# Patient Record
Sex: Female | Born: 1955 | Race: White | Hispanic: No | Marital: Married | Smoking: Never smoker
Health system: Southern US, Community
[De-identification: ages and names within clinical notes are randomized; demographics above are authoritative.]

## PROBLEM LIST (undated history)

## (undated) DIAGNOSIS — Z862 Personal history of diseases of the blood and blood-forming organs and certain disorders involving the immune mechanism: Secondary | ICD-10-CM

## (undated) DIAGNOSIS — C439 Malignant melanoma of skin, unspecified: Secondary | ICD-10-CM

## (undated) DIAGNOSIS — I4891 Unspecified atrial fibrillation: Secondary | ICD-10-CM

## (undated) DIAGNOSIS — M8580 Other specified disorders of bone density and structure, unspecified site: Secondary | ICD-10-CM

## (undated) DIAGNOSIS — I499 Cardiac arrhythmia, unspecified: Secondary | ICD-10-CM

## (undated) HISTORY — PX: BREAST EXCISIONAL BIOPSY: SUR124

## (undated) HISTORY — PX: SPLENECTOMY: SUR1306

---

## 2004-07-07 ENCOUNTER — Ambulatory Visit: Payer: Self-pay | Admitting: Unknown Physician Specialty

## 2005-07-26 ENCOUNTER — Ambulatory Visit: Payer: Self-pay | Admitting: Unknown Physician Specialty

## 2006-07-31 ENCOUNTER — Ambulatory Visit: Payer: Self-pay | Admitting: Unknown Physician Specialty

## 2007-08-15 ENCOUNTER — Ambulatory Visit: Payer: Self-pay | Admitting: Unknown Physician Specialty

## 2008-10-15 ENCOUNTER — Ambulatory Visit: Payer: Self-pay | Admitting: Unknown Physician Specialty

## 2009-02-15 ENCOUNTER — Ambulatory Visit: Payer: Self-pay | Admitting: Gastroenterology

## 2009-12-09 ENCOUNTER — Ambulatory Visit: Payer: Self-pay | Admitting: Unknown Physician Specialty

## 2010-12-12 ENCOUNTER — Ambulatory Visit: Payer: Self-pay | Admitting: Unknown Physician Specialty

## 2011-07-04 HISTORY — PX: BREAST BIOPSY: SHX20

## 2011-12-26 ENCOUNTER — Ambulatory Visit: Payer: Self-pay | Admitting: Obstetrics and Gynecology

## 2012-01-19 ENCOUNTER — Ambulatory Visit: Payer: Self-pay | Admitting: General Surgery

## 2012-01-22 LAB — PATHOLOGY REPORT

## 2012-07-22 ENCOUNTER — Ambulatory Visit: Payer: Self-pay | Admitting: General Surgery

## 2014-10-20 NOTE — Op Note (Signed)
PATIENT NAME:  Chelsea Marquez, Chelsea Marquez MR#:  [id number] DATE OF BIRTH:  [birth year]  DATE OF PROCEDURE:  01/19/2012  PREOPERATIVE DIAGNOSIS: Microcalcifications of left breast.   POSTOPERATIVE DIAGNOSIS: Microcalcifications of left breast.  OPERATIVE PROCEDURE: Needle localization and biopsy of left breast.   SURGEON: Hervey Ard, MD   ANESTHESIA: General by LMA under Dr. Benjamine Mola, Marcaine 0.5% plain, 30 mL local infiltration.   ESTIMATED BLOOD LOSS: Less than 20 mL.   CLINICAL NOTE: This 58 year old woman showed areas of microcalcifications in the retroareolar area. They were felt to be too superficial for successful stereotactic biopsy. Needle localization was completed by [employer], MD, with the wire essentially posterior to the area of concern.   OPERATIVE NOTE: With the patient under adequate general anesthesia, the breast was carefully prepped with ChloraPrep to prevent dislodging the needle. Ultrasound was used to identify the thickened portion of the wire. A curvilinear incision along the edge of the areola, from the 2 to 7 o'clock position was made and carried down through the skin and subcutaneous tissue with hemostasis achieved by electrocautery. The area of the wire was identified superior to the thick portion (which was adjacent to the calcification). A 2 cm diameter core, 4 cm long section of tissue was removed, orientated and sent for specimen radiograph. Only a few calcifications were evident near the superior aspect of the entry point of the wire. Re-review of the post needle localization mammogram suggests that the calcifications might be more medially based. A second piece of tissue 1 cm in diameter encompassing the superior, medial, and anterior aspects of the original biopsy site was then sent, additional mammography failed to show additional calcifications. At this point, it was elected to terminate the procedure. Meticulous hemostasis was achieved with electrocautery. The tissue was  approximated in multiple layers with 2-0 Vicryl figure-of-eight sutures to reconstruct the retroareolar breast volume. The skin    was closed with running 4-0 Vicryl subcuticular suture. Benzoin and Steri-Strips were applied. A fluff gauze dressing, Kerlix, and an Ace wrap was then applied.   The patient tolerated the procedure well and was taken to the recovery room in stable condition.  ____________________________ Robert Bellow, MD jwb:slb D: 01/19/2012 18:08:24 ET T: 01/20/2012 10:02:09 ET JOB#: [id number]  cc: Robert Bellow, MD, <Dictator> Youlanda Roys. Lovie Macadamia, MD Delsa Sale, MD JEFFREY Amedeo Kinsman MD ELECTRONICALLY SIGNED 01/22/2012 20:37

## 2016-05-17 ENCOUNTER — Other Ambulatory Visit: Payer: Self-pay | Admitting: Obstetrics & Gynecology

## 2016-05-17 DIAGNOSIS — Z1231 Encounter for screening mammogram for malignant neoplasm of breast: Secondary | ICD-10-CM

## 2016-06-28 ENCOUNTER — Ambulatory Visit
Admission: RE | Admit: 2016-06-28 | Discharge: 2016-06-28 | Disposition: A | Discharge status: Home / Self Care | Payer: BLUE CROSS/BLUE SHIELD | Source: Ambulatory Visit | Attending: Obstetrics & Gynecology | Admitting: Obstetrics & Gynecology

## 2016-06-28 DIAGNOSIS — Z1231 Encounter for screening mammogram for malignant neoplasm of breast: Secondary | ICD-10-CM | POA: Diagnosis present

## 2017-05-23 ENCOUNTER — Other Ambulatory Visit: Payer: Self-pay | Admitting: Obstetrics & Gynecology

## 2017-05-23 DIAGNOSIS — Z1231 Encounter for screening mammogram for malignant neoplasm of breast: Secondary | ICD-10-CM

## 2017-06-29 ENCOUNTER — Ambulatory Visit
Admission: RE | Admit: 2017-06-29 | Discharge: 2017-06-29 | Disposition: A | Discharge status: Home / Self Care | Payer: BLUE CROSS/BLUE SHIELD | Source: Ambulatory Visit | Attending: Obstetrics & Gynecology | Admitting: Obstetrics & Gynecology

## 2017-06-29 DIAGNOSIS — Z1231 Encounter for screening mammogram for malignant neoplasm of breast: Secondary | ICD-10-CM | POA: Diagnosis present

## 2017-11-29 ENCOUNTER — Other Ambulatory Visit: Payer: Self-pay | Admitting: Student

## 2017-11-29 ENCOUNTER — Ambulatory Visit
Admission: RE | Admit: 2017-11-29 | Discharge: 2017-11-29 | Disposition: A | Discharge status: Home / Self Care | Payer: BLUE CROSS/BLUE SHIELD | Source: Ambulatory Visit | Attending: Student | Admitting: Student

## 2017-11-29 DIAGNOSIS — M79604 Pain in right leg: Secondary | ICD-10-CM

## 2017-11-29 DIAGNOSIS — M79661 Pain in right lower leg: Secondary | ICD-10-CM | POA: Diagnosis not present

## 2018-05-24 ENCOUNTER — Other Ambulatory Visit: Payer: Self-pay | Admitting: Obstetrics & Gynecology

## 2018-05-24 DIAGNOSIS — Z1231 Encounter for screening mammogram for malignant neoplasm of breast: Secondary | ICD-10-CM

## 2018-07-01 ENCOUNTER — Ambulatory Visit
Admission: RE | Admit: 2018-07-01 | Discharge: 2018-07-01 | Disposition: A | Discharge status: Home / Self Care | Payer: BLUE CROSS/BLUE SHIELD | Source: Ambulatory Visit | Attending: Obstetrics & Gynecology | Admitting: Obstetrics & Gynecology

## 2018-07-01 DIAGNOSIS — Z1231 Encounter for screening mammogram for malignant neoplasm of breast: Secondary | ICD-10-CM | POA: Diagnosis present

## 2019-04-07 ENCOUNTER — Other Ambulatory Visit: Payer: Self-pay

## 2019-04-07 DIAGNOSIS — Z20822 Contact with and (suspected) exposure to covid-19: Secondary | ICD-10-CM

## 2019-04-07 DIAGNOSIS — Z20828 Contact with and (suspected) exposure to other viral communicable diseases: Secondary | ICD-10-CM

## 2019-04-09 ENCOUNTER — Telehealth: Payer: Self-pay | Admitting: *Deleted

## 2019-04-09 LAB — NOVEL CORONAVIRUS, NAA: SARS-CoV-2, NAA: NOT DETECTED

## 2019-04-09 NOTE — Telephone Encounter (Signed)
Patient called to obtain COVID 19 test results from test done on 04/07/2019.  Notified patient of negative results.

## 2019-05-19 ENCOUNTER — Other Ambulatory Visit: Payer: Self-pay | Admitting: Obstetrics & Gynecology

## 2019-05-19 DIAGNOSIS — Z1231 Encounter for screening mammogram for malignant neoplasm of breast: Secondary | ICD-10-CM

## 2019-07-03 ENCOUNTER — Ambulatory Visit
Admission: RE | Admit: 2019-07-03 | Discharge: 2019-07-03 | Disposition: A | Discharge status: Home / Self Care | Payer: BC Managed Care – PPO | Source: Ambulatory Visit | Attending: Obstetrics & Gynecology | Admitting: Obstetrics & Gynecology

## 2019-07-03 DIAGNOSIS — Z1231 Encounter for screening mammogram for malignant neoplasm of breast: Secondary | ICD-10-CM

## 2019-12-03 IMAGING — MG DIGITAL SCREENING BILATERAL MAMMOGRAM WITH TOMO AND CAD
8 series · 9 of 24 positions shown · non-contrast
Comparison: Previous exam(s).

CLINICAL DATA: Screening.

EXAM:
DIGITAL SCREENING BILATERAL MAMMOGRAM WITH TOMO AND CAD

[R MLO synth-2D]
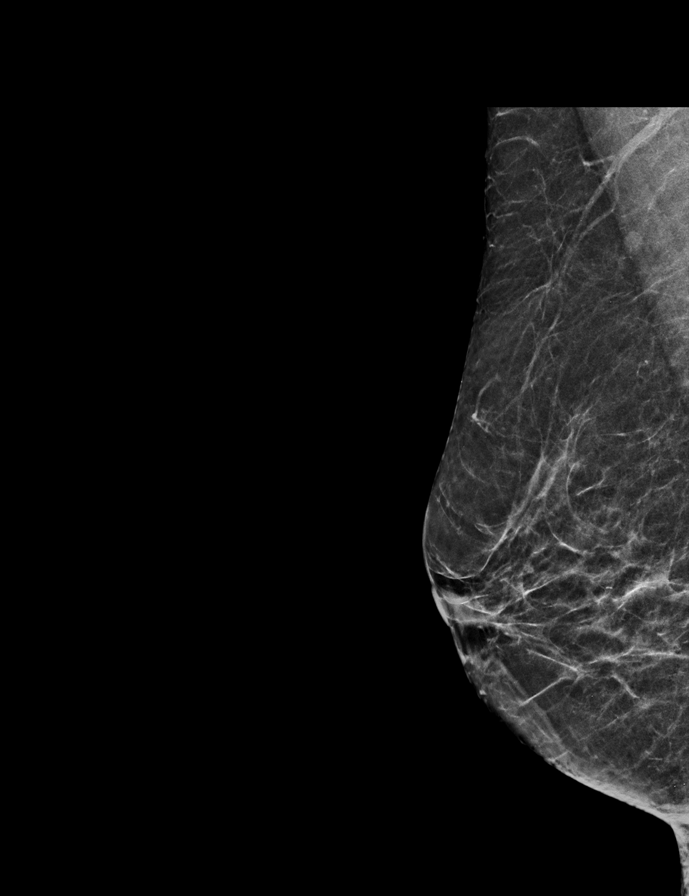

[L MLO synth-2D]
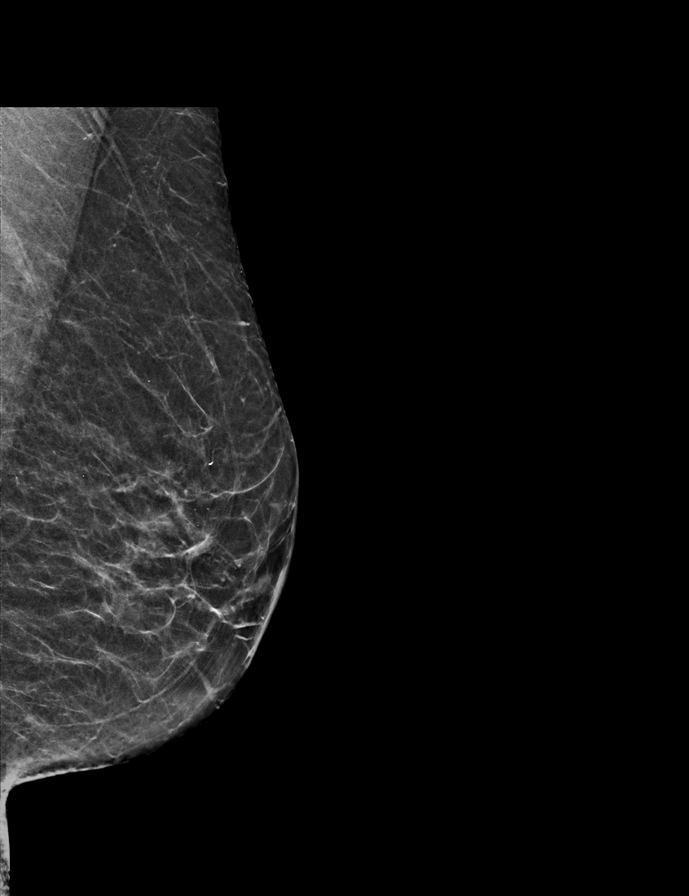

[R CC synth-2D]
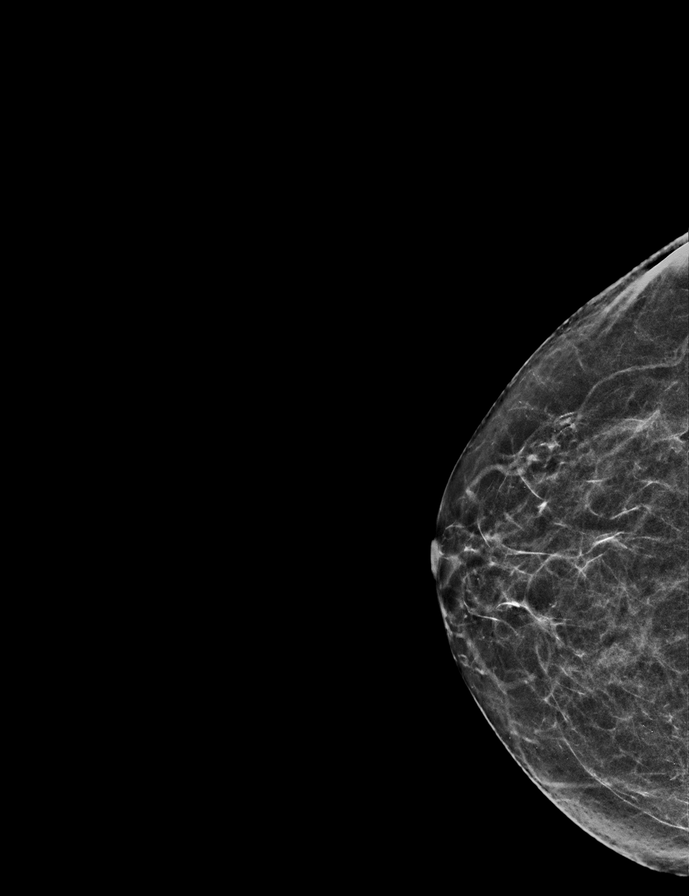

[L CC synth-2D]
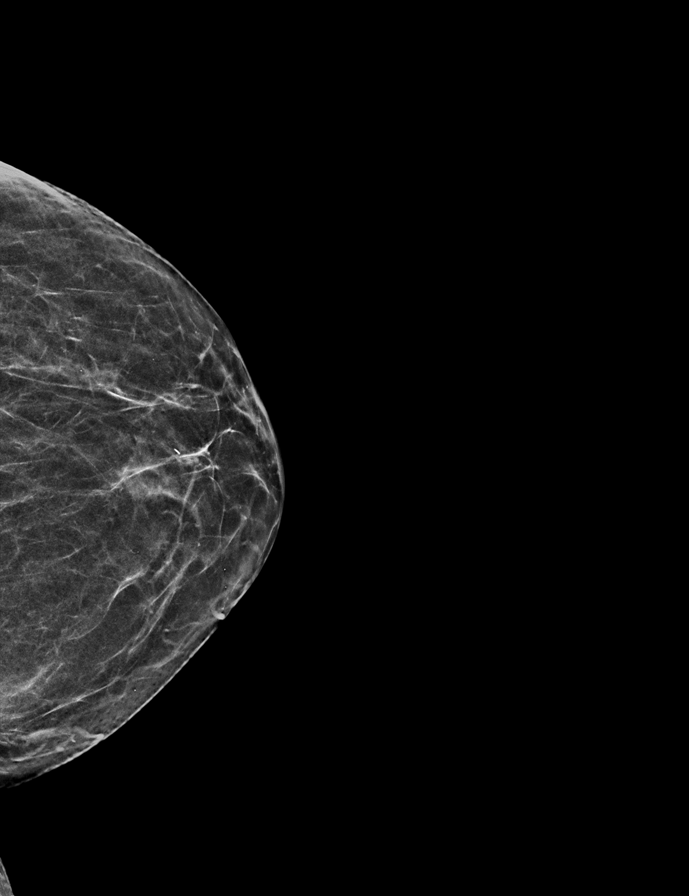

[R MLO tomo · 2 of 52 frames shown]
[frame 17/52]
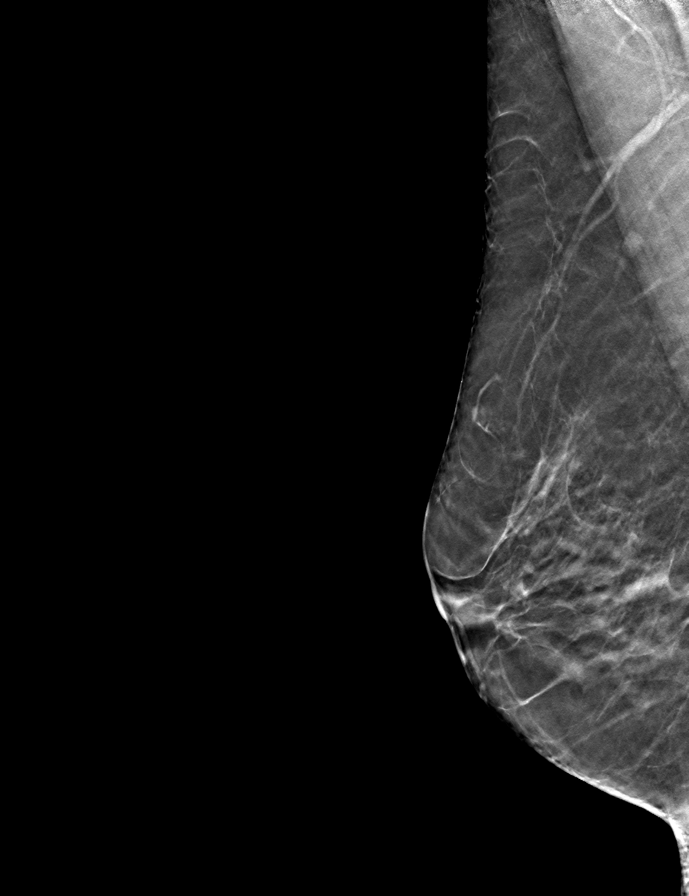
[frame 27/52]
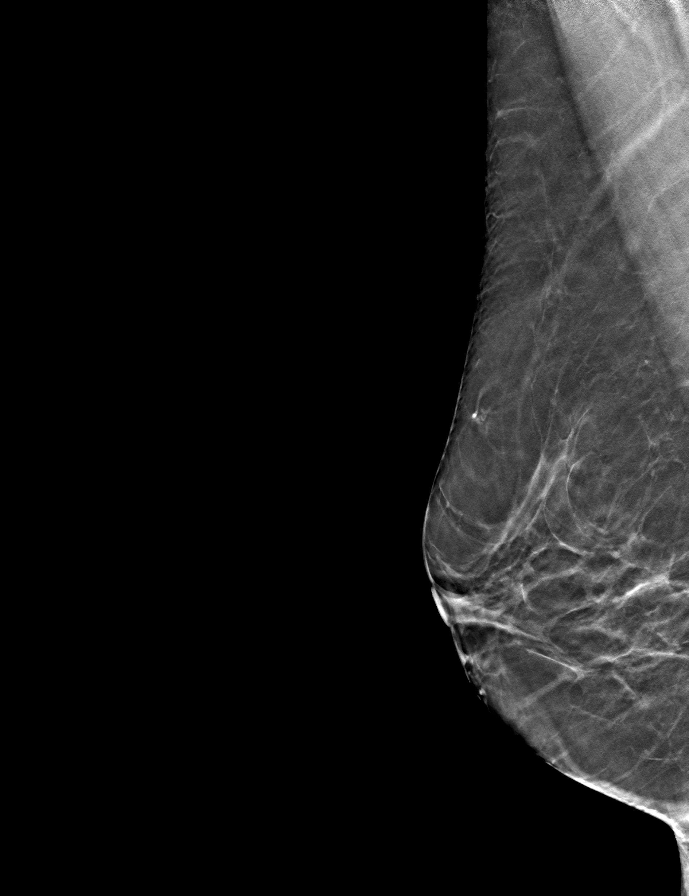

[L CC tomo · tomo slice 23/44.0]
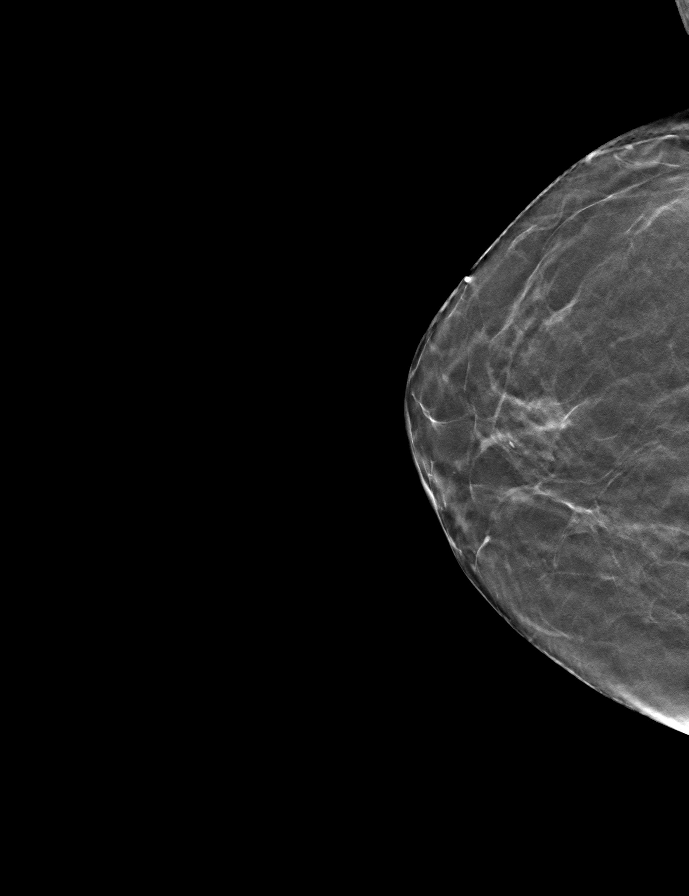

[L MLO tomo · tomo slice 26/51.0]
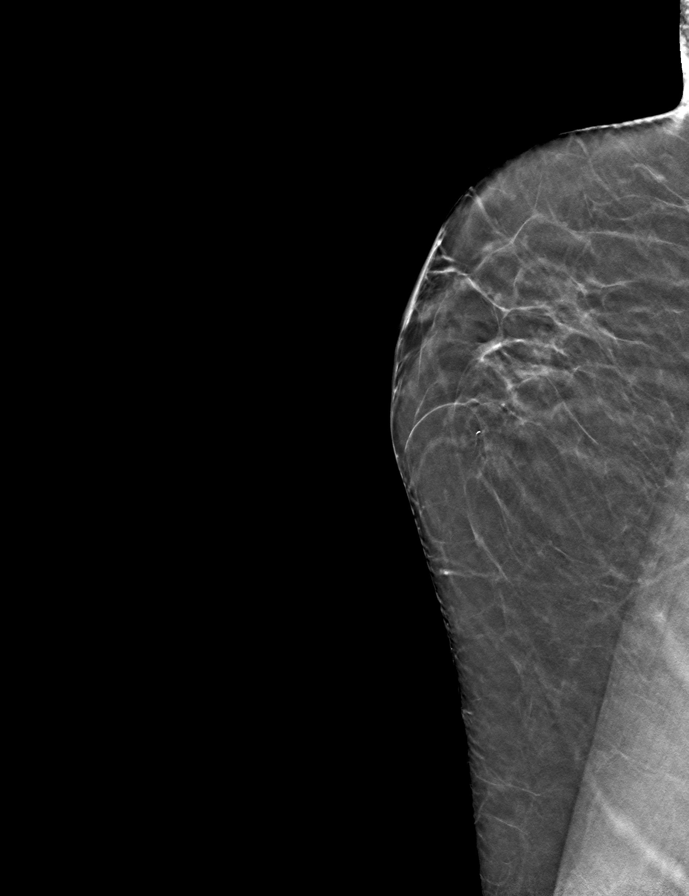

[R CC tomo · tomo slice 23/46.0]
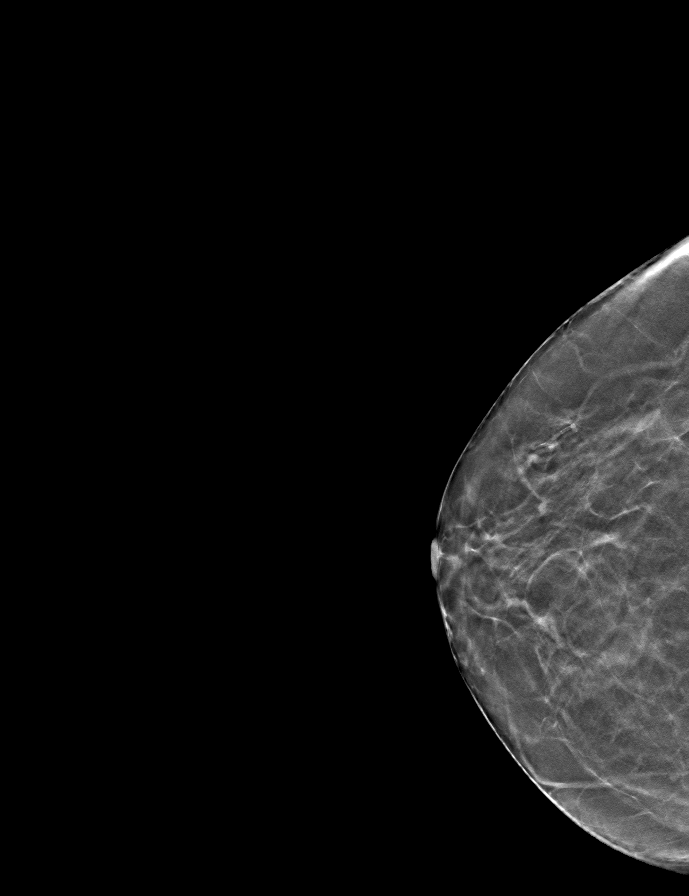

[9 of 24 positions shown; findings below may reference images not displayed]

ACR Breast Density Category b: There are scattered areas of
fibroglandular density.
FINDINGS: There are no findings suspicious for malignancy. Images were
processed with CAD.
IMPRESSION: No mammographic evidence of malignancy. A result letter of this
screening mammogram will be mailed directly to the patient.

RECOMMENDATION:
Screening mammogram in one year. (Code:[id number])

BI-RADS CATEGORY  1: Negative.

## 2020-03-16 ENCOUNTER — Telehealth (HOSPITAL_COMMUNITY): Payer: Self-pay | Admitting: Oncology

## 2020-03-16 ENCOUNTER — Encounter: Payer: Self-pay | Admitting: Oncology

## 2020-03-16 NOTE — Telephone Encounter (Signed)
Called to Discuss with patient about Covid symptoms and the use of regeneron, a monoclonal antibody infusion for those with mild to moderate Covid symptoms and at a high risk of hospitalization.     Pt is qualified for this infusion at the [facility] infusion center due to co-morbid conditions and/or a member of an at-risk group.     Unable to reach pt. Left message to return call  No past medical history on file.  Rulon Abide  AGNP-C [phone] ([place])

## 2020-06-01 ENCOUNTER — Other Ambulatory Visit: Payer: Self-pay | Admitting: Obstetrics & Gynecology

## 2020-06-01 DIAGNOSIS — Z1231 Encounter for screening mammogram for malignant neoplasm of breast: Secondary | ICD-10-CM

## 2020-06-18 ENCOUNTER — Other Ambulatory Visit
Admission: RE | Admit: 2020-06-18 | Discharge: 2020-06-18 | Disposition: A | Discharge status: Home / Self Care | Payer: BC Managed Care – PPO | Source: Ambulatory Visit | Attending: Cardiology | Admitting: Cardiology

## 2020-06-18 ENCOUNTER — Other Ambulatory Visit: Payer: Self-pay

## 2020-06-18 DIAGNOSIS — Z01812 Encounter for preprocedural laboratory examination: Secondary | ICD-10-CM | POA: Diagnosis not present

## 2020-06-18 DIAGNOSIS — Z20822 Contact with and (suspected) exposure to covid-19: Secondary | ICD-10-CM | POA: Insufficient documentation

## 2020-06-19 LAB — SARS CORONAVIRUS 2 (TAT 6-24 HRS): SARS Coronavirus 2: NEGATIVE

## 2020-06-21 ENCOUNTER — Other Ambulatory Visit: Payer: Self-pay | Admitting: Cardiology

## 2020-06-22 ENCOUNTER — Ambulatory Visit
Admission: RE | Admit: 2020-06-22 | Discharge: 2020-06-22 | Disposition: A | Discharge status: Home / Self Care | Payer: BC Managed Care – PPO | Attending: Cardiology | Admitting: Cardiology

## 2020-06-22 ENCOUNTER — Ambulatory Visit: Payer: BC Managed Care – PPO | Admitting: Anesthesiology

## 2020-06-22 ENCOUNTER — Encounter: Payer: Self-pay | Admitting: Cardiology

## 2020-06-22 ENCOUNTER — Encounter
Admission: RE | Disposition: A | Discharge status: Home / Self Care | Payer: Self-pay | Source: Home / Self Care | Attending: Cardiology

## 2020-06-22 DIAGNOSIS — Z79899 Other long term (current) drug therapy: Secondary | ICD-10-CM | POA: Diagnosis not present

## 2020-06-22 DIAGNOSIS — Z85828 Personal history of other malignant neoplasm of skin: Secondary | ICD-10-CM | POA: Diagnosis not present

## 2020-06-22 DIAGNOSIS — Z7901 Long term (current) use of anticoagulants: Secondary | ICD-10-CM | POA: Diagnosis not present

## 2020-06-22 DIAGNOSIS — D693 Immune thrombocytopenic purpura: Secondary | ICD-10-CM | POA: Diagnosis not present

## 2020-06-22 DIAGNOSIS — I480 Paroxysmal atrial fibrillation: Secondary | ICD-10-CM | POA: Diagnosis present

## 2020-06-22 HISTORY — PX: CARDIOVERSION: SHX1299

## 2020-06-22 HISTORY — DX: Unspecified atrial fibrillation: I48.91

## 2020-06-22 HISTORY — DX: Malignant melanoma of skin, unspecified: C43.9

## 2020-06-22 SURGERY — CARDIOVERSION
Anesthesia: General

## 2020-06-22 MED ORDER — HYDROCORTISONE 1 % EX CREA
1.0000 "application " | TOPICAL_CREAM | Freq: Three times a day (TID) | CUTANEOUS | Status: DC | PRN
  Filled 2020-06-22: qty 28

## 2020-06-22 MED ORDER — PROPOFOL 10 MG/ML IV BOLUS
INTRAVENOUS | Status: DC | PRN
Start: 2020-06-22 — End: 2020-06-22
  Administered 2020-06-22: 70 mg via INTRAVENOUS

## 2020-06-22 MED ORDER — FENTANYL CITRATE (PF) 100 MCG/2ML IJ SOLN: 25.0000 ug | INTRAMUSCULAR | Status: DC | PRN

## 2020-06-22 MED ORDER — SODIUM CHLORIDE 0.9 % IV SOLN
INTRAVENOUS | Status: DC
  Administered 2020-06-22: 1000 mL via INTRAVENOUS

## 2020-06-22 MED ORDER — ONDANSETRON HCL 4 MG/2ML IJ SOLN: 4.0000 mg | Freq: Once | INTRAMUSCULAR | Status: DC | PRN

## 2020-06-22 MED ORDER — PROPOFOL 10 MG/ML IV BOLUS
INTRAVENOUS | Status: AC
  Filled 2020-06-22: qty 20

## 2020-06-22 NOTE — Anesthesia Preprocedure Evaluation (Signed)
Anesthesia Evaluation  Patient identified by MRN, date of birth, ID band Patient awake    Reviewed: Allergy & Precautions, NPO status , Patient's Chart, lab work & pertinent test results  Airway Mallampati: II  TM Distance: >3 FB     Dental  (+) Teeth Intact   Pulmonary neg pulmonary ROS,    Pulmonary exam normal        Cardiovascular Normal cardiovascular exam+ dysrhythmias Atrial Fibrillation      Neuro/Psych negative neurological ROS  negative psych ROS   GI/Hepatic negative GI ROS, Neg liver ROS,   Endo/Other  negative endocrine ROS  Renal/GU negative Renal ROS  negative genitourinary   Musculoskeletal negative musculoskeletal ROS (+)   Abdominal Normal abdominal exam  (+)   Peds negative pediatric ROS (+)  Hematology negative hematology ROS (+)   Anesthesia Other Findings   Reproductive/Obstetrics                             Anesthesia Physical Anesthesia Plan  ASA: III  Anesthesia Plan: General   Post-op Pain Management:    Induction: Intravenous  PONV Risk Score and Plan:   Airway Management Planned: Nasal Cannula  Additional Equipment:   Intra-op Plan:   Post-operative Plan:   Informed Consent: I have reviewed the patients History and Physical, chart, labs and discussed the procedure including the risks, benefits and alternatives for the proposed anesthesia with the patient or authorized representative who has indicated his/her understanding and acceptance.     Dental advisory given  Plan Discussed with: CRNA and Surgeon  Anesthesia Plan Comments:         Anesthesia Quick Evaluation

## 2020-06-22 NOTE — Transfer of Care (Signed)
Immediate Anesthesia Transfer of Care Note  Patient: Chelsea Marquez  Procedure(s) Performed: CARDIOVERSION (N/A )  Patient Location: PACU and [facility]  Anesthesia Type:General  Level of Consciousness: drowsy and patient cooperative  Airway & Oxygen Therapy: Patient Spontanous Breathing and Patient connected to nasal cannula oxygen  Post-op Assessment: Report given to RN and Post -op Vital signs reviewed and stable  Post vital signs: Reviewed and stable  Last Vitals:  Vitals Value Taken Time  BP 118/69 06/22/20 0745  Temp    Pulse 58 06/22/20 0746  Resp 19 06/22/20 0746  SpO2 99 % 06/22/20 0746    Last Pain:  Vitals:   06/22/20 0714  TempSrc: Oral  PainSc: 0-No pain         Complications: No complications documented.

## 2020-06-22 NOTE — Discharge Instructions (Signed)
Moderate Conscious Sedation, Adult, Care After These instructions provide you with information about caring for yourself after your procedure. Your health care provider may also give you more specific instructions. Your treatment has been planned according to current medical practices, but problems sometimes occur. Call your health care provider if you have any problems or questions after your procedure. What can I expect after the procedure? After your procedure, it is common:  To feel sleepy for several hours.  To feel clumsy and have poor balance for several hours.  To have poor judgment for several hours.  To vomit if you eat too soon. Follow these instructions at home: For at least 24 hours after the procedure:   Do not: ? Participate in activities where you could fall or become injured. ? Drive. ? Use heavy machinery. ? Drink alcohol. ? Take sleeping pills or medicines that cause drowsiness. ? Make important decisions or sign legal documents. ? Take care of children on your own.  Rest. Eating and drinking  Follow the diet recommended by your health care provider.  If you vomit: ? Drink water, juice, or soup when you can drink without vomiting. ? Make sure you have little or no nausea before eating solid foods. General instructions  Have a responsible adult stay with you until you are awake and alert.  Take over-the-counter and prescription medicines only as told by your health care provider.  If you smoke, do not smoke without supervision.  Keep all follow-up visits as told by your health care provider. This is important. Contact a health care provider if:  You keep feeling nauseous or you keep vomiting.  You feel light-headed.  You develop a rash.  You have a fever. Get help right away if:  You have trouble breathing. This information is not intended to replace advice given to you by your health care provider. Make sure you discuss any questions you have  with your health care provider. Document Revised: 06/01/2017 Document Reviewed: 10/09/2015 Elsevier Patient Education  2020 [employer] Electrical Cardioversion Electrical cardioversion is the delivery of a jolt of electricity to restore a normal rhythm to the heart. A rhythm that is too fast or is not regular keeps the heart from pumping well. In this procedure, sticky patches or metal paddles are placed on the chest to deliver electricity to the heart from a device. This procedure may be done in an emergency if:  There is low or no blood pressure as a result of the heart rhythm.  Normal rhythm must be restored as fast as possible to protect the brain and heart from further damage.  It may save a life. This may also be a scheduled procedure for irregular or fast heart rhythms that are not immediately life-threatening. Tell a health care provider about:  Any allergies you have.  All medicines you are taking, including vitamins, herbs, eye drops, creams, and over-the-counter medicines.  Any problems you or family members have had with anesthetic medicines.  Any blood disorders you have.  Any surgeries you have had.  Any medical conditions you have.  Whether you are pregnant or may be pregnant. What are the risks? Generally, this is a safe procedure. However, problems may occur, including:  Allergic reactions to medicines.  A blood clot that breaks free and travels to other parts of your body.  The possible return of an abnormal heart rhythm within hours or days after the procedure.  Your heart stopping (cardiac arrest). This is rare. What happens   before the procedure? Medicines  Your health care provider may have you start taking: ? Blood-thinning medicines (anticoagulants) so your blood does not clot as easily. ? Medicines to help stabilize your heart rate and rhythm.  Ask your health care provider about: ? Changing or stopping your regular medicines. This is  especially important if you are taking diabetes medicines or blood thinners. ? Taking medicines such as aspirin and ibuprofen. These medicines can thin your blood. Do not take these medicines unless your health care provider tells you to take them. ? Taking over-the-counter medicines, vitamins, herbs, and supplements. General instructions  Follow instructions from your health care provider about eating or drinking restrictions.  Plan to have someone take you home from the hospital or clinic.  If you will be going home right after the procedure, plan to have someone with you for 24 hours.  Ask your health care provider what steps will be taken to help prevent infection. These may include washing your skin with a germ-killing soap. What happens during the procedure?   An IV will be inserted into one of your veins.  Sticky patches (electrodes) or metal paddles may be placed on your chest.  You will be given a medicine to help you relax (sedative).  An electrical shock will be delivered. The procedure may vary among health care providers and hospitals. What can I expect after the procedure?  Your blood pressure, heart rate, breathing rate, and blood oxygen level will be monitored until you leave the hospital or clinic.  Your heart rhythm will be watched to make sure it does not change.  You may have some redness on the skin where the shocks were given. Follow these instructions at home:  Do not drive for 24 hours if you were given a sedative during your procedure.  Take over-the-counter and prescription medicines only as told by your health care provider.  Ask your health care provider how to check your pulse. Check it often.  Rest for 48 hours after the procedure or as told by your health care provider.  Avoid or limit your caffeine use as told by your health care provider.  Keep all follow-up visits as told by your health care provider. This is important. Contact a health  care provider if:  You feel like your heart is beating too quickly or your pulse is not regular.  You have a serious muscle cramp that does not go away. Get help right away if:  You have discomfort in your chest.  You are dizzy or you feel faint.  You have trouble breathing or you are short of breath.  Your speech is slurred.  You have trouble moving an arm or leg on one side of your body.  Your fingers or toes turn cold or blue. Summary  Electrical cardioversion is the delivery of a jolt of electricity to restore a normal rhythm to the heart.  This procedure may be done right away in an emergency or may be a scheduled procedure if the condition is not an emergency.  Generally, this is a safe procedure.  After the procedure, check your pulse often as told by your health care provider. This information is not intended to replace advice given to you by your health care provider. Make sure you discuss any questions you have with your health care provider. Document Revised: 01/20/2019 Document Reviewed: 01/20/2019 Elsevier Patient Education  2020 Elsevier Inc.  

## 2020-06-22 NOTE — Procedures (Signed)
Procedure:  Elective cardioversion  Indication: symptomatic afib  After informed consent, time out protocol and sedation with 70 mg iv propofol per dept of anesthesia, pt received a 120J biphasic, synchronized dc shock with conversion from afib to aflutter. A subsequent 150J synchronized biphasic dc shock converted to nsr. There no immediate complications.

## 2020-06-22 NOTE — Anesthesia Postprocedure Evaluation (Signed)
Anesthesia Post Note  Patient: Chelsea Marquez  Procedure(s) Performed: CARDIOVERSION (N/A )  Patient location during evaluation: Specials Recovery Anesthesia Type: General Level of consciousness: awake and alert and oriented Pain management: pain level controlled Vital Signs Assessment: post-procedure vital signs reviewed and stable Respiratory status: spontaneous breathing Cardiovascular status: blood pressure returned to baseline Anesthetic complications: no   No complications documented.   Last Vitals:  Vitals:   06/22/20 0815 06/22/20 0830  BP: 125/79   Pulse: (!) 56 62  Resp: 19 19  Temp:    SpO2: 99% 100%    Last Pain:  Vitals:   06/22/20 0815  TempSrc:   PainSc: 0-No pain                 Robyn Galati

## 2020-06-22 NOTE — Anesthesia Procedure Notes (Signed)
Procedure Name: MAC Date/Time: 06/22/2020 7:38 AM Performed by: Alvin Critchley, MD Pre-anesthesia Checklist: Patient identified, Emergency Drugs available, Suction available and Patient being monitored Patient Re-evaluated:Patient Re-evaluated prior to induction Oxygen Delivery Method: Nasal cannula

## 2020-07-08 NOTE — H&P (Signed)
Chief Complaint: Chief Complaint  Patient presents with  . Follow-up  Date of Service: 05/12/2020 Date of Birth: March 25, 1956 PCP: Dennison Nancy, MD  History of Present Illness: Ms. 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 At her yearly follow up visit on 03/31/20, she was noted to be in atrial fibrillation. She has a history of atrial fibrillation in the past, following an illness, in which she converted to NSR on amiodarone. She has completed the amiodarone load and has been compliant with Eliquis 5mg  BID with no evidence of side effects or bleeding. She continues to deny chest pain or chest pain, but admits to three instances of exertional bilateral upper shoulder tightness, resolving with rest. She denies shortness of breath, lower extremity swelling, orthopnea, or PND. She admits to increased fatigue, but denies dizziness, lightheadedness, or syncopal/presyncopal episodes. She has not used short acting diltiazem 30mg  for a resting heart rate greater than 100bpm.   We discussed the results of the echocardiogram at today's visit which revealed mildly reduced LV systolic function with an EF estimated at 45% with evidence of regional wall abnormalities. I personally reviewed the ECG performed in the office today which revealed atrial fibrillation at a controlled ventricular response of 91bpm with non-specific T wave abnormalities.   Past Medical and Surgical History  Past Medical History Past Medical History:  Diagnosis Date  . Atrial fibrillation (CMS-HCC)  . Cancer (CMS-HCC)  skin cancer, squamous cell  . H/O abnormal mammogram 2013  flat epithelial atypia  . Immune thrombocytopenia (CMS-HCC)   Past Surgical History She has a past surgical history that includes Splenectomy (1984); Incisional Biopsy Breast (Left); and melanoma  excision.   Medications and Allergies  Current Medications  Current Outpatient Medications  Medication Sig Dispense Refill  . AMIOdarone (PACERONE) 200 MG tablet Take 400mg  (2 tablets) twice a day for 1 week. Take 200mg  (1 tablet) twice a day for 3 weeks. Then take 200mg  (1 tablet) once a day. 40 tablet 3  . apixaban (ELIQUIS) 5 mg tablet Take 1 tablet (5 mg total) by mouth every 12 (twelve) hours 60 tablet 3  . CALCIUM CARBONATE (CALCIUM 500 ORAL) Take by mouth.  . cholecalciferol (VITAMIN D3) 1,000 unit capsule Take by mouth.  . diltiazem (CARDIZEM) 30 MG tablet TAKE 1 TABLET (30 MG TOTAL) BY MOUTH 4 (FOUR) TIMES DAILY 60 tablet 0  . magnesium 250 mg Tab Take 400 mg by mouth.  . OMEGA-3/DHA/EPA/FISH OIL (FISH OIL-OMEGA-3 FATTY ACIDS) 300-1,000 mg capsule Take by mouth.   No current facility-administered medications for this visit.   Allergies: Patient has no known allergies.  Social and Family History  Social History reports that she has never smoked. She has never used smokeless tobacco. She reports that she does not drink alcohol and does not use drugs.  Family History Family History  Problem Relation Age of Onset  . Emphysema Mother  . Osteoporosis (Thinning of bones) Mother  . Heart failure Father  . Prostate cancer Father  . Skin cancer Father  carcinoma  . Pacemaker Father  . Atrial fibrillation (Abnormal heart rhythm sometimes requiring treatment with blood thinners) Father  . High blood pressure (Hypertension) Brother  . Breast cancer Cousin  . Pulmonary fibrosis Brother   Review of Systems   Review of Systems: The patient denies chest pain, shortness of breath, orthopnea, paroxysmal nocturnal dyspnea, pedal  edema, palpitations, heart racing, fatigue, dizziness, lightheadedness, presyncope, syncope, leg pain, leg cramping. Review of 12 Systems is negative except as described in HPI.   Physical Examination   Vitals:BP 120/72  Pulse 92  Resp 16  Ht 170.2 cm (5'  7")  Wt 69.4 kg (153 lb)  BMI 23.96 kg/m  Ht:170.2 cm (5\' 7" ) Wt:69.4 kg (153 lb) surface area is 1.81 meters squared. Body mass index is 23.96 kg/m.  General: Well developed, well nourished. In no acute distress HEENT: Pupils equally reactive to light and accomodation  Neck: Supple without thyromegaly, or goiter. Carotid pulses 2+. No carotid bruits present.  Pulmonary: Clear to auscultation bilaterally; no wheezes, rales, rhonchi Cardiovascular: Irregularly irregular, rapid ventricular response. No gallops, murmurs or rubs Gastrointestinal: Soft nontender, nondistended, with normal bowel sounds Extremities: No cyanosis, clubbing, or edema Peripheral Pulses: 2+ in upper extremities, 2+ in lower extremities  Neurology: Alert and oriented X3 Pysch: Good affect. Responds appropriately  Assessment and Plan   65 y.o. female with  1. Paroxysmal atrial fibrillation (CMS-HCC)  -Currently in atrial fibrillation, with controlled ventricular response -Has completed amiodarone load; will reduce to 200mg  once daily  -Okay to use diltiazem 30mg  PRN for resting HR >100  -If she fails to convert on antiarrythmic therapy, will proceed with DCCV following 4 weeks of uninterrupted anticoagulation  -TSH, potassium, magnesium all within normal range  -Echocardiogram revealing mildly reduced LV systolic function with evidence of regional wall abnormalities - will proceed with ETT Myoview to rule out ischemic pathology  2.  3. History of ITP  -Occurred 25 years ago while pregnant; appears stable  Exertional chest pain  -With recent episodes of exertional left arm tightness, will proceed with an ETT Myoview     Orders Placed This Encounter  Procedures  . NM myocardial perfusion SPECT multiple (stress and rest)  . ECG 12-lead  . ECG stress test only   Return in about 6 weeks (around 06/23/2020).   Pt seen and examined. No change from above.

## 2020-08-04 ENCOUNTER — Ambulatory Visit
Admission: RE | Admit: 2020-08-04 | Discharge: 2020-08-04 | Disposition: A | Discharge status: Home / Self Care | Payer: Medicare PPO | Source: Ambulatory Visit | Attending: Obstetrics & Gynecology | Admitting: Obstetrics & Gynecology

## 2020-08-04 ENCOUNTER — Other Ambulatory Visit: Payer: Self-pay

## 2020-08-04 DIAGNOSIS — Z1231 Encounter for screening mammogram for malignant neoplasm of breast: Secondary | ICD-10-CM | POA: Diagnosis present

## 2021-02-08 ENCOUNTER — Ambulatory Visit: Payer: Medicare PPO | Admitting: Anesthesiology

## 2021-02-08 ENCOUNTER — Encounter: Payer: Self-pay | Admitting: Anesthesiology

## 2021-02-08 ENCOUNTER — Ambulatory Visit
Admission: RE | Admit: 2021-02-08 | Discharge: 2021-02-08 | Disposition: A | Discharge status: Home / Self Care | Payer: Medicare PPO | Attending: Gastroenterology | Admitting: Gastroenterology

## 2021-02-08 ENCOUNTER — Encounter
Admission: RE | Disposition: A | Discharge status: Home / Self Care | Payer: Self-pay | Source: Home / Self Care | Attending: Gastroenterology

## 2021-02-08 ENCOUNTER — Other Ambulatory Visit: Payer: Self-pay

## 2021-02-08 DIAGNOSIS — I4891 Unspecified atrial fibrillation: Secondary | ICD-10-CM | POA: Diagnosis not present

## 2021-02-08 DIAGNOSIS — Z85820 Personal history of malignant melanoma of skin: Secondary | ICD-10-CM | POA: Diagnosis not present

## 2021-02-08 DIAGNOSIS — Z79899 Other long term (current) drug therapy: Secondary | ICD-10-CM | POA: Insufficient documentation

## 2021-02-08 DIAGNOSIS — Z1211 Encounter for screening for malignant neoplasm of colon: Secondary | ICD-10-CM | POA: Insufficient documentation

## 2021-02-08 DIAGNOSIS — K5730 Diverticulosis of large intestine without perforation or abscess without bleeding: Secondary | ICD-10-CM | POA: Diagnosis not present

## 2021-02-08 DIAGNOSIS — Z7901 Long term (current) use of anticoagulants: Secondary | ICD-10-CM | POA: Diagnosis not present

## 2021-02-08 HISTORY — DX: Cardiac arrhythmia, unspecified: I49.9

## 2021-02-08 HISTORY — DX: Personal history of diseases of the blood and blood-forming organs and certain disorders involving the immune mechanism: Z86.2

## 2021-02-08 HISTORY — DX: Other specified disorders of bone density and structure, unspecified site: M85.80

## 2021-02-08 HISTORY — PX: COLONOSCOPY WITH PROPOFOL: SHX5780

## 2021-02-08 SURGERY — COLONOSCOPY WITH PROPOFOL
Anesthesia: General

## 2021-02-08 MED ORDER — PROPOFOL 500 MG/50ML IV EMUL
INTRAVENOUS | Status: AC
  Filled 2021-02-08: qty 50

## 2021-02-08 MED ORDER — SODIUM CHLORIDE 0.9 % IV SOLN: INTRAVENOUS | Status: DC

## 2021-02-08 MED ORDER — PROPOFOL 500 MG/50ML IV EMUL
INTRAVENOUS | Status: DC | PRN
  Administered 2021-02-08: 150 ug/kg/min via INTRAVENOUS

## 2021-02-08 NOTE — Op Note (Signed)
[facility] Gastroenterology Patient Name: Chelsea Marquez Procedure Date: 02/08/2021 10:46 AM MRN: [id number] Account #: [id number] Date of Birth: [birth year] Admit Type: Outpatient Age: 65 Room: [facility] ENDO ROOM 1 Gender: Female Note Status: Finalized Procedure:             Colonoscopy Indications:           Screening for colorectal malignant neoplasm Providers:             Andrey Farmer MD, MD Referring MD:          Youlanda Roys. Lovie Macadamia, MD (Referring MD) Medicines:             Monitored Anesthesia Care Complications:         No immediate complications. Estimated blood loss: None. Procedure:             Pre-Anesthesia Assessment:                        - Prior to the procedure, a History and Physical was                         performed, and patient medications and allergies were                         reviewed. The patient is competent. The risks and                         benefits of the procedure and the sedation options and                         risks were discussed with the patient. All questions                         were answered and informed consent was obtained.                         Patient identification and proposed procedure were                         verified by the physician, the nurse, the anesthetist                         and the technician in the endoscopy suite. Mental                         Status Examination: alert and oriented. Airway                         Examination: normal oropharyngeal airway and neck                         mobility. Respiratory Examination: clear to                         auscultation. CV Examination: normal. Prophylactic                         Antibiotics: The patient does not require prophylactic  antibiotics. Prior Anticoagulants: The patient has                         taken no previous anticoagulant or antiplatelet                         agents. ASA Grade Assessment: II - A  patient with mild                         systemic disease. After reviewing the risks and                         benefits, the patient was deemed in satisfactory                         condition to undergo the procedure. The anesthesia                         plan was to use monitored anesthesia care (MAC).                         Immediately prior to administration of medications,                         the patient was re-assessed for adequacy to receive                         sedatives. The heart rate, respiratory rate, oxygen                         saturations, blood pressure, adequacy of pulmonary                         ventilation, and response to care were monitored                         throughout the procedure. The physical status of the                         patient was re-assessed after the procedure.                        After obtaining informed consent, the colonoscope was                         passed under direct vision. Throughout the procedure,                         the patient's blood pressure, pulse, and oxygen                         saturations were monitored continuously. The                         Colonoscope was introduced through the anus and                         advanced to the the cecum, identified by appendiceal  orifice and ileocecal valve. The colonoscopy was                         performed without difficulty. The patient tolerated                         the procedure well. The quality of the bowel                         preparation was excellent. Findings:      The perianal and digital rectal examinations were normal.      Scattered small-mouthed diverticula were found in the sigmoid colon,       transverse colon and ascending colon.      The exam was otherwise without abnormality on direct and retroflexion       views. Impression:            - Diverticulosis in the sigmoid colon, in the                          transverse colon and in the ascending colon.                        - The examination was otherwise normal on direct and                         retroflexion views.                        - No specimens collected. Recommendation:        - Discharge patient to home.                        - Resume previous diet.                        - Continue present medications.                        - Repeat colonoscopy in 10 years for screening                         purposes.                        - Return to referring physician as previously                         scheduled. Procedure Code(s):     --- Professional ---                        I0165, Colorectal cancer screening; colonoscopy on                         individual not meeting criteria for high risk Diagnosis Code(s):     --- Professional ---                        Z12.11, Encounter for screening for malignant neoplasm  of colon                        K57.30, Diverticulosis of large intestine without                         perforation or abscess without bleeding CPT copyright 2019 [employer]. All rights reserved. The codes documented in this report are preliminary and upon coder review may  be revised to meet current compliance requirements. Andrey Farmer MD, MD 02/08/2021 11:36:04 AM Number of Addenda: 0 Note Initiated On: 02/08/2021 10:46 AM Scope Withdrawal Time: 0 hours 8 minutes 30 seconds  Total Procedure Duration: 0 hours 13 minutes 37 seconds  Estimated Blood Loss:  Estimated blood loss: none.      [facility]

## 2021-02-08 NOTE — Transfer of Care (Signed)
Immediate Anesthesia Transfer of Care Note  Patient: Chelsea Marquez  Procedure(s) Performed: COLONOSCOPY WITH PROPOFOL  Patient Location: PACU  Anesthesia Type:General  Level of Consciousness: awake and sedated  Airway & Oxygen Therapy: Patient Spontanous Breathing and Patient connected to nasal cannula oxygen  Post-op Assessment: Report given to RN and Post -op Vital signs reviewed and stable  Post vital signs: Reviewed and stable  Last Vitals:  Vitals Value Taken Time  BP    Temp    Pulse    Resp    SpO2      Last Pain:  Vitals:   02/08/21 1031  TempSrc: Temporal  PainSc: 0-No pain         Complications: No notable events documented.

## 2021-02-08 NOTE — Anesthesia Preprocedure Evaluation (Signed)
Anesthesia Evaluation  Patient identified by MRN, date of birth, ID band Patient awake    Reviewed: Allergy & Precautions, NPO status   History of Anesthesia Complications Negative for: history of anesthetic complications  Airway Mallampati: I  TM Distance: >3 FB Neck ROM: Full    Dental no notable dental hx. (+) Teeth Intact   Pulmonary    Pulmonary exam normal        Cardiovascular Exercise Tolerance: Good Normal cardiovascular examI     Neuro/Psych    GI/Hepatic   Endo/Other    Renal/GU      Musculoskeletal   Abdominal Normal abdominal exam  (+)   Peds  Hematology   Anesthesia Other Findings   Reproductive/Obstetrics                             Anesthesia Physical Anesthesia Plan  ASA: 1  Anesthesia Plan: General   Post-op Pain Management:    Induction:   PONV Risk Score and Plan:   Airway Management Planned: Simple Face Mask  Additional Equipment:   Intra-op Plan:   Post-operative Plan:   Informed Consent: I have reviewed the patients History and Physical, chart, labs and discussed the procedure including the risks, benefits and alternatives for the proposed anesthesia with the patient or authorized representative who has indicated his/her understanding and acceptance.       Plan Discussed with: CRNA  Anesthesia Plan Comments:         Anesthesia Quick Evaluation

## 2021-02-08 NOTE — Interval H&P Note (Signed)
History and Physical Interval Note:  02/08/2021 10:52 AM  Chelsea Marquez  has presented today for surgery, with the diagnosis of SCREENING.  The various methods of treatment have been discussed with the patient and family. After consideration of risks, benefits and other options for treatment, the patient has consented to  Procedure(s) with comments: COLONOSCOPY WITH PROPOFOL (N/A) - PREFERS AM as a surgical intervention.  The patient's history has been reviewed, patient examined, no change in status, stable for surgery.  I have reviewed the patient's chart and labs.  Questions were answered to the patient's satisfaction.     Lesly Rubenstein  Ok to proceed with colonoscopy

## 2021-02-08 NOTE — Anesthesia Postprocedure Evaluation (Signed)
Anesthesia Post Note  Patient: Chelsea Marquez  Procedure(s) Performed: COLONOSCOPY WITH PROPOFOL  Patient location during evaluation: PACU Anesthesia Type: General Level of consciousness: awake and alert Pain management: pain level controlled Vital Signs Assessment: post-procedure vital signs reviewed and stable Respiratory status: spontaneous breathing, nonlabored ventilation, respiratory function stable and patient connected to nasal cannula oxygen Cardiovascular status: blood pressure returned to baseline and stable Postop Assessment: no apparent nausea or vomiting Anesthetic complications: no   No notable events documented.   Last Vitals:  Vitals:   02/08/21 1147 02/08/21 1157  BP: 116/67 123/62  Pulse: 67 64  Resp: 15 18  Temp:    SpO2: 100% 100%    Last Pain:  Vitals:   02/08/21 1157  TempSrc:   PainSc: 0-No pain                 Arianah Torgeson Doyne Keel

## 2021-02-08 NOTE — Anesthesia Procedure Notes (Signed)
Date/Time: 02/08/2021 11:22 AM Performed by: Vaughan Sine Pre-anesthesia Checklist: Patient identified, Emergency Drugs available, Suction available, Patient being monitored and Timeout performed Patient Re-evaluated:Patient Re-evaluated prior to induction Oxygen Delivery Method: Nasal cannula Induction Type: IV induction Placement Confirmation: positive ETCO2 and CO2 detector

## 2021-02-08 NOTE — H&P (Signed)
Outpatient short stay form Pre-procedure 02/08/2021 10:50 AM Raylene Miyamoto MD, MPH  Primary Physician: Dr. 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 No family history of GI malignancies. No blood thinners.    Current Facility-Administered Medications:    0.9 %  sodium chloride infusion, , Intravenous, Continuous, Kalissa Grays, Hilton Cork, MD, Last Rate: 20 mL/hr at 02/08/21 1048, New Bag at 02/08/21 1048  Medications Prior to Admission  Medication Sig Dispense Refill Last Dose   Cholecalciferol (VITAMIN D3) 125 MCG (5000 UT) CAPS Take 5,000 Units by mouth daily.   02/07/2021   Nutritional Supplements (JUICE PLUS FIBRE PO) Take 4 tablets by mouth daily. Vegetable 2 Fruit 2   02/07/2021   omega-3 acid ethyl esters (LOVAZA) 1 g capsule Take 2,000 mg by mouth daily.   02/07/2021   amiodarone (PACERONE) 200 MG tablet Take 200 mg by mouth daily. (Patient not taking: Reported on 02/08/2021)   Not Taking   calcium carbonate (OS-CAL - DOSED IN MG OF ELEMENTAL CALCIUM) 1250 (500 Ca) MG tablet Take 1 tablet by mouth.   02/06/2021   diltiazem (CARDIZEM) 30 MG tablet Take 30 mg by mouth 4 (four) times daily. (Patient not taking: Reported on 02/08/2021)   Not Taking   ELIQUIS 5 MG TABS tablet Take 5 mg by mouth 2 (two) times daily. (Patient not taking: Reported on 02/08/2021)   Not Taking   MAGNESIUM PO Take 2,500 mg by mouth daily.   02/06/2021     No Known Allergies   Past Medical History:  Diagnosis Date   Atrial fibrillation (HCC)    Dysrhythmia    History of ITP    Melanoma ([place])    Osteopenia     Review of systems:  Otherwise negative.    Physical Exam  Gen: Alert, oriented. Appears stated age.  HEENT: PERRLA. Lungs: No respiratory distress CV: RRR Abd: soft, benign, no masses Ext: No edema    Planned procedures: Proceed with colonoscopy. The  patient understands the nature of the planned procedure, indications, risks, alternatives and potential complications including but not limited to bleeding, infection, perforation, damage to internal organs and possible oversedation/side effects from anesthesia. The patient agrees and gives consent to proceed.  Please refer to procedure notes for findings, recommendations and patient disposition/instructions.     Raylene Miyamoto MD, MPH Gastroenterology 02/08/2021  10:50 AM

## 2021-02-09 ENCOUNTER — Encounter: Payer: Self-pay | Admitting: Gastroenterology

## 2021-06-21 ENCOUNTER — Other Ambulatory Visit: Payer: Self-pay | Admitting: Family Medicine

## 2021-06-21 DIAGNOSIS — Z1231 Encounter for screening mammogram for malignant neoplasm of breast: Secondary | ICD-10-CM

## 2021-08-10 ENCOUNTER — Other Ambulatory Visit: Payer: Self-pay

## 2021-08-10 ENCOUNTER — Ambulatory Visit
Admission: RE | Admit: 2021-08-10 | Discharge: 2021-08-10 | Disposition: A | Discharge status: Home / Self Care | Payer: Medicare PPO | Source: Ambulatory Visit | Attending: Family Medicine | Admitting: Family Medicine

## 2021-08-10 DIAGNOSIS — Z1231 Encounter for screening mammogram for malignant neoplasm of breast: Secondary | ICD-10-CM | POA: Insufficient documentation

## 2022-06-29 ENCOUNTER — Other Ambulatory Visit: Payer: Self-pay | Admitting: Family Medicine

## 2022-06-29 DIAGNOSIS — Z1231 Encounter for screening mammogram for malignant neoplasm of breast: Secondary | ICD-10-CM

## 2022-08-29 ENCOUNTER — Ambulatory Visit
Admission: RE | Admit: 2022-08-29 | Discharge: 2022-08-29 | Disposition: A | Discharge status: Home / Self Care | Payer: Medicare PPO | Source: Ambulatory Visit | Attending: Family Medicine | Admitting: Family Medicine

## 2022-08-29 DIAGNOSIS — Z1231 Encounter for screening mammogram for malignant neoplasm of breast: Secondary | ICD-10-CM

## 2022-09-11 ENCOUNTER — Ambulatory Visit: Payer: Self-pay

## 2023-08-12 ENCOUNTER — Other Ambulatory Visit: Payer: Self-pay

## 2023-08-12 ENCOUNTER — Ambulatory Visit
Admission: EM | Admit: 2023-08-12 | Discharge: 2023-08-12 | Disposition: A | Discharge status: Home / Self Care | Payer: Medicare PPO

## 2023-08-12 ENCOUNTER — Emergency Department: Payer: Medicare PPO

## 2023-08-12 ENCOUNTER — Inpatient Hospital Stay
Admission: EM | Admit: 2023-08-12 | Discharge: 2023-08-14 | DRG: 202 | Disposition: A | Discharge status: Home / Self Care | Payer: Medicare PPO | Attending: Internal Medicine | Admitting: Internal Medicine

## 2023-08-12 ENCOUNTER — Encounter: Payer: Self-pay | Admitting: Internal Medicine

## 2023-08-12 DIAGNOSIS — Z79899 Other long term (current) drug therapy: Secondary | ICD-10-CM | POA: Diagnosis not present

## 2023-08-12 DIAGNOSIS — Z1152 Encounter for screening for COVID-19: Secondary | ICD-10-CM | POA: Diagnosis not present

## 2023-08-12 DIAGNOSIS — Z862 Personal history of diseases of the blood and blood-forming organs and certain disorders involving the immune mechanism: Secondary | ICD-10-CM

## 2023-08-12 DIAGNOSIS — Z9081 Acquired absence of spleen: Secondary | ICD-10-CM | POA: Diagnosis not present

## 2023-08-12 DIAGNOSIS — E871 Hypo-osmolality and hyponatremia: Secondary | ICD-10-CM | POA: Diagnosis not present

## 2023-08-12 DIAGNOSIS — J209 Acute bronchitis, unspecified: Secondary | ICD-10-CM | POA: Diagnosis present

## 2023-08-12 DIAGNOSIS — D693 Immune thrombocytopenic purpura: Secondary | ICD-10-CM | POA: Diagnosis present

## 2023-08-12 DIAGNOSIS — I480 Paroxysmal atrial fibrillation: Secondary | ICD-10-CM | POA: Diagnosis present

## 2023-08-12 DIAGNOSIS — R7981 Abnormal blood-gas level: Secondary | ICD-10-CM

## 2023-08-12 DIAGNOSIS — J189 Pneumonia, unspecified organism: Principal | ICD-10-CM

## 2023-08-12 DIAGNOSIS — Z85820 Personal history of malignant melanoma of skin: Secondary | ICD-10-CM

## 2023-08-12 DIAGNOSIS — R058 Other specified cough: Secondary | ICD-10-CM

## 2023-08-12 DIAGNOSIS — J9811 Atelectasis: Secondary | ICD-10-CM | POA: Diagnosis present

## 2023-08-12 DIAGNOSIS — Z7952 Long term (current) use of systemic steroids: Secondary | ICD-10-CM | POA: Diagnosis not present

## 2023-08-12 DIAGNOSIS — R0602 Shortness of breath: Secondary | ICD-10-CM

## 2023-08-12 LAB — COMPREHENSIVE METABOLIC PANEL
ALT: 18 U/L (ref 0–44)
AST: 27 U/L (ref 15–41)
Albumin: 3.8 g/dL (ref 3.5–5.0)
Alkaline Phosphatase: 57 U/L (ref 38–126)
Anion gap: 15 (ref 5–15)
BUN: 11 mg/dL (ref 8–23)
CO2: 23 mmol/L (ref 22–32)
Calcium: 8.8 mg/dL — ABNORMAL LOW (ref 8.9–10.3)
Chloride: 94 mmol/L — ABNORMAL LOW (ref 98–111)
Creatinine, Ser: 0.64 mg/dL (ref 0.44–1.00)
GFR, Estimated: >60 mL/min (ref >60)
Glucose, Bld: 131 mg/dL — ABNORMAL HIGH (ref 70–99)
Potassium: 3.7 mmol/L (ref 3.5–5.1)
Sodium: 132 mmol/L — ABNORMAL LOW (ref 135–145)
Total Bilirubin: 1 mg/dL (ref 0.0–1.2)
Total Protein: 7.8 g/dL (ref 6.5–8.1)

## 2023-08-12 LAB — URINALYSIS, W/ REFLEX TO CULTURE (INFECTION SUSPECTED)
Bacteria, UA: NONE SEEN
Bilirubin Urine: NEGATIVE
Glucose, UA: NEGATIVE mg/dL
Hgb urine dipstick: NEGATIVE
Ketones, ur: 5 mg/dL — AB
Leukocytes,Ua: NEGATIVE
Nitrite: NEGATIVE
Protein, ur: 30 mg/dL — AB
Specific Gravity, Urine: 1.019 (ref 1.005–1.030)
pH: 6 (ref 5.0–8.0)

## 2023-08-12 LAB — RESP PANEL BY RT-PCR (RSV, FLU A&B, COVID)  RVPGX2
Influenza A by PCR: NEGATIVE
Influenza B by PCR: NEGATIVE
Resp Syncytial Virus by PCR: NEGATIVE
SARS Coronavirus 2 by RT PCR: NEGATIVE

## 2023-08-12 LAB — PROTIME-INR
INR: 1 (ref 0.8–1.2)
Prothrombin Time: 13.7 s (ref 11.4–15.2)

## 2023-08-12 LAB — LACTIC ACID, PLASMA: Lactic Acid, Venous: 1.4 mmol/L (ref 0.5–1.9)

## 2023-08-12 MED ORDER — ALBUTEROL SULFATE (2.5 MG/3ML) 0.083% IN NEBU: 2.5000 mg | INHALATION_SOLUTION | RESPIRATORY_TRACT | Status: DC | PRN

## 2023-08-12 MED ORDER — ALBUTEROL SULFATE (2.5 MG/3ML) 0.083% IN NEBU
2.5000 mg | INHALATION_SOLUTION | Freq: Once | RESPIRATORY_TRACT | Status: AC
  Administered 2023-08-12: 2.5 mg via RESPIRATORY_TRACT
  Filled 2023-08-12: qty 3

## 2023-08-12 MED ORDER — IPRATROPIUM-ALBUTEROL 0.5-2.5 (3) MG/3ML IN SOLN
3.0000 mL | RESPIRATORY_TRACT | Status: DC
  Administered 2023-08-13 (×4): 3 mL via RESPIRATORY_TRACT
  Filled 2023-08-12 (×4): qty 3

## 2023-08-12 MED ORDER — SODIUM CHLORIDE 0.9 % IV SOLN
500.0000 mg | Freq: Once | INTRAVENOUS | Status: AC
  Administered 2023-08-12: 500 mg via INTRAVENOUS
  Filled 2023-08-12: qty 5

## 2023-08-12 MED ORDER — METHYLPREDNISOLONE SODIUM SUCC 125 MG IJ SOLR
80.0000 mg | INTRAMUSCULAR | Status: DC
  Administered 2023-08-13 – 2023-08-14 (×2): 80 mg via INTRAVENOUS
  Filled 2023-08-12 (×2): qty 2

## 2023-08-12 MED ORDER — METHYLPREDNISOLONE SODIUM SUCC 125 MG IJ SOLR
125.0000 mg | Freq: Once | INTRAMUSCULAR | Status: AC
  Administered 2023-08-12: 125 mg via INTRAVENOUS
  Filled 2023-08-12: qty 2

## 2023-08-12 MED ORDER — ACETAMINOPHEN 325 MG PO TABS
650.0000 mg | ORAL_TABLET | Freq: Four times a day (QID) | ORAL | Status: DC | PRN
  Administered 2023-08-14: 650 mg via ORAL
  Filled 2023-08-12: qty 2

## 2023-08-12 MED ORDER — SODIUM CHLORIDE 0.9 % IV SOLN
1.0000 g | Freq: Once | INTRAVENOUS | Status: AC
  Administered 2023-08-12: 1 g via INTRAVENOUS
  Filled 2023-08-12: qty 10

## 2023-08-12 MED ORDER — ALBUTEROL SULFATE (2.5 MG/3ML) 0.083% IN NEBU
2.5000 mg | INHALATION_SOLUTION | RESPIRATORY_TRACT | Status: AC
  Administered 2023-08-12: 2.5 mg via RESPIRATORY_TRACT

## 2023-08-12 MED ORDER — ONDANSETRON HCL 4 MG/2ML IJ SOLN: 4.0000 mg | Freq: Three times a day (TID) | INTRAMUSCULAR | Status: DC | PRN

## 2023-08-12 MED ORDER — DM-GUAIFENESIN ER 30-600 MG PO TB12
1.0000 | ORAL_TABLET | Freq: Two times a day (BID) | ORAL | Status: DC | PRN
  Administered 2023-08-13 – 2023-08-14 (×2): 1 via ORAL
  Filled 2023-08-12 (×2): qty 1

## 2023-08-12 NOTE — ED Notes (Signed)
 Called lab, [facility] says "currently being processed". Using chart labels.

## 2023-08-12 NOTE — ED Provider Notes (Signed)
 Chelsea Marquez    CSN: [id number] Arrival date & time: 08/12/23  1443      History   Chief Complaint Chief Complaint  Patient presents with   Cough    HPI Chelsea Marquez is a 68 y.o. female.  Patient presents with productive cough and chest congestion x >1 week.  Her symptoms are getting worse.  She has mild shortness of breath with exertion; none at rest.  No chest pain.  She had fever at the onset of the flu but none in the last 5 days.  Patient was seen at [facility] clinic on 08/06/2023; diagnosed with influenza A, bronchitis, cough; treated with Tamiflu, albuterol  inhaler, prednisone , Tessalon Perles, Promethazine DM.  The history is provided by the patient and medical records.    Past Medical History:  Diagnosis Date   Atrial fibrillation (HCC)    Dysrhythmia    History of ITP    Melanoma (HCC)    Osteopenia     There are no active problems to display for this patient.   Past Surgical History:  Procedure Laterality Date   BREAST EXCISIONAL BIOPSY Left    NEG @2013    CARDIOVERSION N/A 06/22/2020   Procedure: CARDIOVERSION;  Surgeon: Bosie Vinie LABOR, MD;  Location: [facility] ORS;  Service: Cardiovascular;  Laterality: N/A;   COLONOSCOPY WITH PROPOFOL  N/A 02/08/2021   Procedure: COLONOSCOPY WITH PROPOFOL ;  Surgeon: Maryruth Ole DASEN, MD;  Location: [facility] ENDOSCOPY;  Service: Endoscopy;  Laterality: N/A;  PREFERS AM   SPLENECTOMY      OB History   No obstetric history on file.      Home Medications    Prior to Admission medications   Medication Sig Start Date End Date Taking? Authorizing Provider  albuterol  (VENTOLIN  HFA) 108 (90 Base) MCG/ACT inhaler Inhale into the lungs. 08/06/23  Yes [provider]  benzonatate (TESSALON) 200 MG capsule Take by mouth. 08/06/23  Yes [provider]  predniSONE  (DELTASONE ) 20 MG tablet Take 40 mg by mouth daily. 08/06/23  Yes [provider]  promethazine-dextromethorphan  (PROMETHAZINE-DM) 6.25-15 MG/5ML  syrup Take by mouth. 08/06/23  Yes [provider]  amiodarone (PACERONE) 200 MG tablet Take 200 mg by mouth daily. Patient not taking: Reported on 02/08/2021 05/20/20   [provider]  calcium carbonate (OS-CAL - DOSED IN MG OF ELEMENTAL CALCIUM) 1250 (500 Ca) MG tablet Take 1 tablet by mouth.    [provider]  Cholecalciferol (VITAMIN D3) 125 MCG (5000 UT) CAPS Take 5,000 Units by mouth daily.    [provider]  diltiazem (CARDIZEM) 30 MG tablet Take 30 mg by mouth 4 (four) times daily. Patient not taking: Reported on 02/08/2021    [provider]  ELIQUIS 5 MG TABS tablet Take 5 mg by mouth 2 (two) times daily. Patient not taking: Reported on 02/08/2021 06/10/20   [provider]  MAGNESIUM PO Take 2,500 mg by mouth daily.    [provider]  Nutritional Supplements (JUICE PLUS FIBRE PO) Take 4 tablets by mouth daily. Vegetable 2 Fruit 2    [provider]  omega-3 acid ethyl esters (LOVAZA) 1 g capsule Take 2,000 mg by mouth daily.    [provider]    Family History Family History  Problem Relation Age of Onset   Breast cancer Cousin 41    Social History Social History   Tobacco Use   Smoking status: Never   Smokeless tobacco: Never  Vaping Use   Vaping status: Never  Used  Substance Use Topics   Alcohol use: Not Currently   Drug use: Never     Allergies   Patient has no known allergies.   Review of Systems Review of Systems  Constitutional:  Negative for chills and fever.  HENT:  Positive for congestion. Negative for sore throat.   Respiratory:  Positive for cough and shortness of breath.   Cardiovascular:  Negative for chest pain and palpitations.     Physical Exam Triage Vital Signs ED Triage Vitals  Encounter Vitals Group     BP      Systolic BP Percentile      Diastolic BP Percentile      Pulse      Resp      Temp      Temp src      SpO2      Weight      Height      Head  Circumference      Peak Flow      Pain Score      Pain Loc      Pain Education      Exclude from Growth Chart    No data found.  Updated Vital Signs BP 128/70   Pulse (!) 110   Temp 98.4 F (36.9 C)   Resp 18   SpO2 90%   Visual Acuity Right Eye Distance:   Left Eye Distance:   Bilateral Distance:    Right Eye Near:   Left Eye Near:    Bilateral Near:     Physical Exam Constitutional:      General: She is not in acute distress. HENT:     Mouth/Throat:     Mouth: Mucous membranes are moist.  Cardiovascular:     Rate and Rhythm: Normal rate and regular rhythm.     Heart sounds: Normal heart sounds.  Pulmonary:     Effort: Pulmonary effort is normal. No respiratory distress.     Breath sounds: Normal breath sounds.  Neurological:     Mental Status: She is alert.      UC Treatments / Results  Labs (all labs ordered are listed, but only abnormal results are displayed) Labs Reviewed - No data to display  EKG   Radiology No results found.  Procedures Procedures (including critical care time)  Medications Ordered in UC Medications  albuterol  (PROVENTIL ) (2.5 MG/3ML) 0.083% nebulizer solution 2.5 mg (2.5 mg Nebulization Given 08/12/23 1555)    Initial Impression / Assessment and Plan / UC Course  I have reviewed the triage vital signs and the nursing notes.  Pertinent labs & imaging results that were available during my care of the patient were reviewed by me and considered in my medical decision making (see chart for details).    Borderline low oxygen saturation level, productive cough, shortness of breath.  O2 sat 87 to 90% on room air upon arrival.  Albuterol  nebulizer treatment given here but O2 sat remains at 90% after treatment.  Patient was diagnosed with influenza on 08/06/2023.  She was treated with Tamiflu, albuterol , prednisone .  Her symptoms have progressively gotten worse.  Sending her to the ED for evaluation.  Patient is accompanied by her  husband who will take her to the ED.  They agree to plan of care.  Final Clinical Impressions(s) / UC Diagnoses   Final diagnoses:  Borderline low oxygen saturation level  Productive cough  Shortness of breath     Discharge Instructions  Your oxygen level is staying at 90% even though we gave you a breathing treatment.    Please go to the emergency department for evaluation.     ED Prescriptions   None    PDMP not reviewed this encounter.   Corlis Burnard DEL, NP 08/12/23 [phone]

## 2023-08-12 NOTE — ED Provider Notes (Signed)
 [facility] Provider Note    Event Date/Time   First MD Initiated Contact with Patient 08/12/23 1649     (approximate)   History   Cough   HPI  Chelsea Marquez is a 68 y.o. female with a history of atrial fibrillation, ITP, and osteopenia who presents with persistent productive cough and shortness of breath over the last several days.  The patient reports greenish sputum coming up when she coughs.  She states that she initially had cough, shortness of breath, body aches, and malaise last week and was diagnosed with influenza and bronchitis.  She was started on several medications and completed them, but started to feel worse again over the last few days.  She went to urgent care and was sent to the ED for hypoxia.  I reviewed the past medical records.  I confirmed that the patient was seen at [facility] urgent care earlier today with the same symptoms.  She was borderline hypoxic there despite an albuterol  nebulizer treatment so was sent to the ED for further evaluation.   Physical Exam   Triage Vital Signs: ED Triage Vitals  Encounter Vitals Group     BP 08/12/23 1628 (!) 110/58     Systolic BP Percentile --      Diastolic BP Percentile --      Pulse Rate 08/12/23 1628 (!) 114     Resp 08/12/23 1628 20     Temp 08/12/23 1628 98.9 F (37.2 C)     Temp Source 08/12/23 1628 Oral     SpO2 08/12/23 1626 (!) 86 %     Weight 08/12/23 1630 156 lb (70.8 kg)     Height 08/12/23 1630 5' 7 (1.702 m)     Head Circumference --      Peak Flow --      Pain Score 08/12/23 1627 0     Pain Loc --      Pain Education --      Exclude from Growth Chart --     Most recent vital signs: Vitals:   08/12/23 1633 08/12/23 1644  BP:    Pulse:    Resp:    Temp:    SpO2: 90% 95%     General: Alert, no distress.  CV:  Good peripheral perfusion.  Resp:  Normal effort.  Coarse breath sounds bilaterally with faint wheezing. Abd:  No distention.  Other:  No peripheral  edema.   ED Results / Procedures / Treatments   Labs (all labs ordered are listed, but only abnormal results are displayed) Labs Reviewed  COMPREHENSIVE METABOLIC PANEL - Abnormal; Notable for the following components:      Result Value   Sodium 132 (*)    Chloride 94 (*)    Glucose, Bld 131 (*)    Calcium 8.8 (*)    All other components within normal limits  RESP PANEL BY RT-PCR (RSV, FLU A&B, COVID)  RVPGX2  CULTURE, BLOOD (ROUTINE X 2)  CULTURE, BLOOD (ROUTINE X 2)  EXPECTORATED SPUTUM ASSESSMENT W GRAM STAIN, RFLX TO RESP C  LACTIC ACID, PLASMA  PROTIME-INR  CBC WITH DIFFERENTIAL/PLATELET  URINALYSIS, W/ REFLEX TO CULTURE (INFECTION SUSPECTED)  HIV ANTIBODY (ROUTINE TESTING W REFLEX)  STREP PNEUMONIAE URINARY ANTIGEN  LEGIONELLA PNEUMOPHILA SEROGP 1 UR AG  BASIC METABOLIC PANEL  CBC     EKG     RADIOLOGY  Chest x-ray: I independently viewed and interpreted the images; there are bilateral lower lobe faint opacities, possible  atelectasis versus infiltrate  PROCEDURES:  Critical Care performed: No  Procedures   MEDICATIONS ORDERED IN ED: Medications  ipratropium-albuterol  (DUONEB) 0.5-2.5 (3) MG/3ML nebulizer solution 3 mL (3 mLs Nebulization Patient Refused/Not Given 08/12/23 1950)  albuterol  (PROVENTIL ) (2.5 MG/3ML) 0.083% nebulizer solution 2.5 mg (has no administration in time range)  dextromethorphan -guaiFENesin  (MUCINEX  DM) 30-600 MG per 12 hr tablet 1 tablet (has no administration in time range)  methylPREDNISolone  sodium succinate (SOLU-MEDROL ) 125 mg/2 mL injection 80 mg (has no administration in time range)  ondansetron  (ZOFRAN ) injection 4 mg (has no administration in time range)  acetaminophen  (TYLENOL ) tablet 650 mg (has no administration in time range)  azithromycin  (ZITHROMAX ) 500 mg in sodium chloride  0.9 % 250 mL IVPB (0 mg Intravenous Stopped 08/12/23 1950)  cefTRIAXone  (ROCEPHIN ) 1 g in sodium chloride  0.9 % 100 mL IVPB (0 g Intravenous Stopped  08/12/23 1950)  methylPREDNISolone  sodium succinate (SOLU-MEDROL ) 125 mg/2 mL injection 125 mg (125 mg Intravenous Given 08/12/23 1838)  albuterol  (PROVENTIL ) (2.5 MG/3ML) 0.083% nebulizer solution 2.5 mg (2.5 mg Nebulization Given 08/12/23 1848)  albuterol  (PROVENTIL ) (2.5 MG/3ML) 0.083% nebulizer solution 2.5 mg (2.5 mg Nebulization Given 08/12/23 1847)     IMPRESSION / MDM / ASSESSMENT AND PLAN / ED COURSE  I reviewed the triage vital signs and the nursing notes.  68 year old female with PMH as noted above presents with productive cough for the last several days after initially developing respiratory symptoms last week and being diagnosed with the flu.  She was sent to ED from urgent care due to hypoxia.  Here she is requiring 4 L O2 by nasal cannula to maintain an O2 saturation in the high 90s.  Differential diagnosis includes, but is not limited to, influenza, other viral infection, bronchitis, pneumonia.  The patient has no chest pain or any DVT symptoms and has a productive cough; I therefore have a low suspicion for PE.  Respiratory panel is negative.  Lactate is normal.  Chest x-ray shows lower lobe atelectasis versus infiltrate.  Patient's presentation is most consistent with acute presentation with potential threat to life or bodily function.  The patient is on the cardiac monitor to evaluate for evidence of arrhythmia and/or significant heart rate changes.  ----------------------------------------- 7:00 PM on 08/12/2023 -----------------------------------------  I consulted Dr. Hilma from the hospitalist service; based on our discussion he agrees to evaluate the patient for admission.     FINAL CLINICAL IMPRESSION(S) / ED DIAGNOSES   Final diagnoses:  Community acquired pneumonia, unspecified laterality     Rx / DC Orders   ED Discharge Orders     None        Note:  This document was prepared using Dragon voice recognition software and may include unintentional dictation  errors.    Jacolyn Pae, MD 08/12/23 [phone]

## 2023-08-12 NOTE — Discharge Instructions (Signed)
 Your oxygen level is staying at 90% even though we gave you a breathing treatment.    Please go to the emergency department for evaluation.

## 2023-08-12 NOTE — H&P (Signed)
 History and Physical    Chelsea Marquez FMW:[id number] DOB: Nov 20, 1955 DOA: 08/12/2023  Referring MD/NP/PA:   PCP: Glover Lenis, MD   Patient coming from:  The patient is coming from home.     Chief Complaint: Cough, SOB  HPI: Chelsea Marquez is a 68 y.o. female with medical history significant of A-fib not on anticoagulants, ITP, melanoma, who presents with cough and shortness of breath.  Patient states that she has been sick for more than 10 days.  Patient has cough and SOB.  Patient was seen by PCP on 2/3 and diagnosed with Flu and bronchitis.  Patient was treated with Tamiflu, steroid and cough medication, but no improvement.  Patient continues to have a productive cough with yellow-colored thick mucus production and  shortness of breath which has been progressively worsening.  Denies chest pain, fever or chills.  No sore throat.  No nausea, vomiting, diarrhea or abdominal pain.  No symptoms of UTI.  Data reviewed independently and ED Course: pt was found to have GFR> 60, pending CBC, lactic acid of 1.4, negative PCR for flu, COVID and RSV, INR 1.0.  Temperature normal, blood pressure 91/46, heart rate 114, RR 20, oxygen saturation 86/89% on room air, which improved to 95% on 4L oxygen.  Chest x-ray showed mild lung base atelectasis vs. infiltrate.  Patient is admitted to PCU as inpatient   EKG: I have personally reviewed.  Sinus rhythm, QTc 419, RAD, right bundle blockade, early R wave progression, low voltage.   Review of Systems:   General: no fevers, chills, no body weight gain, has fatigue HEENT: no blurry vision, hearing changes or sore throat Respiratory: has dyspnea, coughing, wheezing CV: no chest pain, no palpitations GI: no nausea, vomiting, abdominal pain, diarrhea, constipation GU: no dysuria, burning on urination, increased urinary frequency, hematuria  Ext: no leg edema Neuro: no unilateral weakness, numbness, or tingling, no vision change or hearing loss Skin: no  rash, no skin tear. MSK: No muscle spasm, no deformity, no limitation of range of movement in spin Heme: No easy bruising.  Travel history: No recent long distant travel.   Allergy: No Known Allergies  Past Medical History:  Diagnosis Date   Atrial fibrillation (HCC)    Dysrhythmia    History of ITP    Melanoma (HCC)    Osteopenia     Past Surgical History:  Procedure Laterality Date   BREAST EXCISIONAL BIOPSY Left    NEG @2013    CARDIOVERSION N/A 06/22/2020   Procedure: CARDIOVERSION;  Surgeon: Bosie Vinie LABOR, MD;  Location: [facility] ORS;  Service: Cardiovascular;  Laterality: N/A;   COLONOSCOPY WITH PROPOFOL  N/A 02/08/2021   Procedure: COLONOSCOPY WITH PROPOFOL ;  Surgeon: Maryruth Ole DASEN, MD;  Location: [facility] ENDOSCOPY;  Service: Endoscopy;  Laterality: N/A;  PREFERS AM   SPLENECTOMY      Social History:  reports that she has never smoked. She has never used smokeless tobacco. She reports that she does not currently use alcohol. She reports that she does not use drugs.  Family History:  Family History  Problem Relation Age of Onset   Breast cancer Cousin 55     Prior to Admission medications   Medication Sig Start Date End Date Taking? Authorizing Provider  albuterol  (VENTOLIN  HFA) 108 (90 Base) MCG/ACT inhaler Inhale into the lungs. 08/06/23   [provider]  amiodarone (PACERONE) 200 MG tablet Take 200 mg by mouth daily. Patient not taking: Reported on 02/08/2021 05/20/20   [provider]  benzonatate (TESSALON) 200 MG capsule Take by mouth. 08/06/23   [provider]  calcium carbonate (OS-CAL - DOSED IN MG OF ELEMENTAL CALCIUM) 1250 (500 Ca) MG tablet Take 1 tablet by mouth.    [provider]  Cholecalciferol (VITAMIN D3) 125 MCG (5000 UT) CAPS Take 5,000 Units by mouth daily.    [provider]  diltiazem (CARDIZEM) 30 MG tablet Take 30 mg by mouth 4 (four) times daily. Patient not taking: Reported on 02/08/2021    [provider]  ELIQUIS 5 MG TABS tablet Take 5 mg by mouth 2 (two) times daily. Patient not taking: Reported on 02/08/2021 06/10/20   [provider]  MAGNESIUM PO Take 2,500 mg by mouth daily.    [provider]  Nutritional Supplements (JUICE PLUS FIBRE PO) Take 4 tablets by mouth daily. Vegetable 2 Fruit 2    [provider]  omega-3 acid ethyl esters (LOVAZA) 1 g capsule Take 2,000 mg by mouth daily.    [provider]  predniSONE  (DELTASONE ) 20 MG tablet Take 40 mg by mouth daily. 08/06/23   [provider]  promethazine-dextromethorphan  (PROMETHAZINE-DM) 6.25-15 MG/5ML syrup Take by mouth. 08/06/23   [provider]    Physical Exam: Vitals:   08/12/23 2300 08/12/23 2330 08/13/23 0000 08/13/23 0030  BP: 111/62 110/68 122/66 (!) 113/58  Pulse: 81 78 78 74  Resp: 19 16 16 15   Temp:      TempSrc:      SpO2: 97% 98% 99% 98%  Weight:      Height:       General: Not in acute distress HEENT:       Eyes: PERRL, EOMI, no jaundice       ENT: No discharge from the ears and nose, no pharynx injection, no tonsillar enlargement.        Neck: No JVD, no bruit, no mass felt. Heme: No neck lymph node enlargement. Cardiac: S1/S2, RRR, No murmurs, No gallops or rubs. Respiratory: has mild wheezing and scattered rhonchi bilaterally GI: Soft, nondistended, nontender, no rebound pain, no organomegaly, BS present. GU: No hematuria Ext: No pitting leg edema bilaterally. 1+DP/PT pulse bilaterally. Musculoskeletal: No joint deformities, No joint redness or warmth, no limitation of ROM in spin. Skin: No rashes.  Neuro: Alert, oriented X3, cranial nerves II-XII grossly intact, moves all extremities normally.  Psych: Patient is not psychotic, no suicidal or hemocidal ideation.  Labs on Admission: I have personally reviewed following labs and imaging studies  CBC: No results for input(s): WBC, NEUTROABS, HGB, HCT, MCV, PLT in the last 168  hours. Basic Metabolic Panel: Recent Labs  Lab 08/12/23 1630  NA 132*  K 3.7  CL 94*  CO2 23  GLUCOSE 131*  BUN 11  CREATININE 0.64  CALCIUM 8.8*   GFR: Estimated Creatinine Clearance: 66.4 mL/min (by C-G formula based on SCr of 0.64 mg/dL). Liver Function Tests: Recent Labs  Lab 08/12/23 1630  AST 27  ALT 18  ALKPHOS 57  BILITOT 1.0  PROT 7.8  ALBUMIN 3.8   No results for input(s): LIPASE, AMYLASE in the last 168 hours. No results for input(s): AMMONIA in the last 168 hours. Coagulation Profile: Recent Labs  Lab 08/12/23 1630  INR 1.0   Cardiac Enzymes: No results for input(s): CKTOTAL, CKMB, CKMBINDEX, TROPONINI in the last 168 hours. BNP (last 3 results) No results for input(s): PROBNP in the last 8760 hours. HbA1C: No results for input(s): HGBA1C in the last 72  hours. CBG: No results for input(s): GLUCAP in the last 168 hours. Lipid Profile: No results for input(s): CHOL, HDL, LDLCALC, TRIG, CHOLHDL, LDLDIRECT in the last 72 hours. Thyroid Function Tests: No results for input(s): TSH, T4TOTAL, FREET4, T3FREE, THYROIDAB in the last 72 hours. Anemia Panel: No results for input(s): VITAMINB12, FOLATE, FERRITIN, TIBC, IRON, RETICCTPCT in the last 72 hours. Urine analysis:    Component Value Date/Time   COLORURINE YELLOW (A) 08/12/2023 1843   APPEARANCEUR HAZY (A) 08/12/2023 1843   LABSPEC 1.019 08/12/2023 1843   PHURINE 6.0 08/12/2023 1843   GLUCOSEU NEGATIVE 08/12/2023 1843   HGBUR NEGATIVE 08/12/2023 1843   BILIRUBINUR NEGATIVE 08/12/2023 1843   KETONESUR 5 (A) 08/12/2023 1843   PROTEINUR 30 (A) 08/12/2023 1843   NITRITE NEGATIVE 08/12/2023 1843   LEUKOCYTESUR NEGATIVE 08/12/2023 1843   Sepsis Labs: @LABRCNTIP (procalcitonin:4,lacticidven:4) ) Recent Results (from the past 240 hours)  Resp panel by RT-PCR (RSV, Flu A&B, Covid) Anterior Nasal Swab     Status: None   Collection Time: 08/12/23   4:31 PM   Specimen: Anterior Nasal Swab  Result Value Ref Range Status   SARS Coronavirus 2 by RT PCR NEGATIVE NEGATIVE Final    Comment: (NOTE) SARS-CoV-2 target nucleic acids are NOT DETECTED.  The SARS-CoV-2 RNA is generally detectable in upper respiratory specimens during the acute phase of infection. The lowest concentration of SARS-CoV-2 viral copies this assay can detect is 138 copies/mL. A negative result does not preclude SARS-Cov-2 infection and should not be used as the sole basis for treatment or other patient management decisions. A negative result may occur with  improper specimen collection/handling, submission of specimen other than nasopharyngeal swab, presence of viral mutation(s) within the areas targeted by this assay, and inadequate number of viral copies(<138 copies/mL). A negative result must be combined with clinical observations, patient history, and epidemiological information. The expected result is Negative.  Fact Sheet for Patients:  bloggercourse.com  Fact Sheet for Healthcare Providers:  seriousbroker.it  This test is no t yet approved or cleared by the [place]  FDA and  has been authorized for detection and/or diagnosis of SARS-CoV-2 by FDA under an Emergency Use Authorization (EUA). This EUA will remain  in effect (meaning this test can be used) for the duration of the COVID-19 declaration under Section 564(b)(1) of the Act, 21 U.S.C.section 360bbb-3(b)(1), unless the authorization is terminated  or revoked sooner.       Influenza A by PCR NEGATIVE NEGATIVE Final   Influenza B by PCR NEGATIVE NEGATIVE Final    Comment: (NOTE) The Xpert Xpress SARS-CoV-2/FLU/RSV plus assay is intended as an aid in the diagnosis of influenza from Nasopharyngeal swab specimens and should not be used as a sole basis for treatment. Nasal washings and aspirates are unacceptable for Xpert Xpress  SARS-CoV-2/FLU/RSV testing.  Fact Sheet for Patients: bloggercourse.com  Fact Sheet for Healthcare Providers: seriousbroker.it  This test is not yet approved or cleared by the [place]  FDA and has been authorized for detection and/or diagnosis of SARS-CoV-2 by FDA under an Emergency Use Authorization (EUA). This EUA will remain in effect (meaning this test can be used) for the duration of the COVID-19 declaration under Section 564(b)(1) of the Act, 21 U.S.C. section 360bbb-3(b)(1), unless the authorization is terminated or revoked.     Resp Syncytial Virus by PCR NEGATIVE NEGATIVE Final    Comment: (NOTE) Fact Sheet for Patients: bloggercourse.com  Fact Sheet for Healthcare Providers: seriousbroker.it  This test is not yet approved or  cleared by the [place]  FDA and has been authorized for detection and/or diagnosis of SARS-CoV-2 by FDA under an Emergency Use Authorization (EUA). This EUA will remain in effect (meaning this test can be used) for the duration of the COVID-19 declaration under Section 564(b)(1) of the Act, 21 U.S.C. section 360bbb-3(b)(1), unless the authorization is terminated or revoked.  Performed at [facility], [address], [place], [place] [address]      Radiological Exams on Admission:   Assessment/Plan Principal Problem:   Acute bronchitis Active Problems:   PAF (paroxysmal atrial fibrillation) (HCC)   History of ITP   Assessment and Plan:  Acute bronchitis after Flu A infection: Patient has persistent cough and shortness of breath.  Now has 4L new oxygen requirement.  Chest x-ray showed mild basilar atelectasis versus infiltration, cannot completely rule out superimposed pneumonia.  CBC is still pending.  No fever, lactic acid normal 1.4, does not seem to have sepsis.  - Will admit to PCU as inpt - IV Rocephin  and  azithromycin  - Incentive spirometry - Solu-Medrol  80 mg daily after 125 mg  - Mucinex  for cough  - Bronchodilators - Urine legionella and S. pneumococcal antigen - Follow up blood culture x2, sputum culture  Hx of PAF (paroxysmal atrial fibrillation) (HCC): pt had cardioversion, no recurrent A-fib per patient.  Currently patient is not taking medications.  Heart rate 100s. -Telemonitoring.  History of ITP: Pending CBC -Follow-up CBC    DVT ppx: SQ Lovenox  Code Status: Full code   Family Communication: Yes, patient's  husband  at bed side.     Disposition Plan:  Anticipate discharge back to previous environment  Consults called:  none  Admission status and Level of care: Progressive:  as inpt        Dispo: The patient is from: Home              Anticipated d/c is to: Home              Anticipated d/c date is: 2 days              Patient currently is not medically stable to d/c.    Severity of Illness:  The appropriate patient status for this patient is INPATIENT. Inpatient status is judged to be reasonable and necessary in order to provide the required intensity of service to ensure the patient's safety. The patient's presenting symptoms, physical exam findings, and initial radiographic and laboratory data in the context of their chronic comorbidities is felt to place them at high risk for further clinical deterioration. Furthermore, it is not anticipated that the patient will be medically stable for discharge from the hospital within 2 midnights of admission.   * I certify that at the point of admission it is my clinical judgment that the patient will require inpatient hospital care spanning beyond 2 midnights from the point of admission due to high intensity of service, high risk for further deterioration and high frequency of surveillance required.*       Date of Service 08/13/2023    Caleb Exon [facility]   If 7PM-7AM, please contact  night-coverage [email or web address] 08/13/2023, 1:27 AM

## 2023-08-12 NOTE — ED Triage Notes (Signed)
 Pt to ED for cough since last week with green sputum. States when she starts coughing she cannot stop sometimes. Sent from UC. Pt is 86% on RA, placed on 2L. States sometimes is painful when coughing.  Placed on 2L then 3L in triage. Then up to 6L because kept dropping to 88%.

## 2023-08-12 NOTE — ED Triage Notes (Addendum)
 Patient to Urgent Care with complaints of a productive cough that started February 1st. Patient was diagnosed with Flu A/ bronchitis at [facility] clinic walk in 2/3. Using albuterol / promethazine- DM/ prednisone / tessalon from [facility] visit.   Reports congestion is now green/ thick- difficult to produce. No respiratory hx.  RA sat in triage 89-90%.

## 2023-08-12 NOTE — ED Notes (Signed)
 Pt to xray then room 14.

## 2023-08-12 NOTE — ED Notes (Signed)
 Patient is being discharged from the Urgent Care and sent to the Emergency Department via POV . Per Burnard Cork NP, patient is in need of higher level of care due to hypoxia. Patient is aware and verbalizes understanding of plan of care.  Vitals:   08/12/23 1549  BP: 128/70  Pulse: (!) 110  Resp: 18  Temp: 98.4 F (36.9 C)  SpO2: 90%

## 2023-08-13 DIAGNOSIS — Z862 Personal history of diseases of the blood and blood-forming organs and certain disorders involving the immune mechanism: Secondary | ICD-10-CM | POA: Diagnosis not present

## 2023-08-13 DIAGNOSIS — J209 Acute bronchitis, unspecified: Secondary | ICD-10-CM | POA: Diagnosis not present

## 2023-08-13 DIAGNOSIS — I480 Paroxysmal atrial fibrillation: Secondary | ICD-10-CM | POA: Diagnosis not present

## 2023-08-13 LAB — CBC
HCT: 41.6 % (ref 36.0–46.0)
Hemoglobin: 13.9 g/dL (ref 12.0–15.0)
MCH: 29.4 pg (ref 26.0–34.0)
MCHC: 33.4 g/dL (ref 30.0–36.0)
MCV: 87.9 fL (ref 80.0–100.0)
Platelets: 313 10*3/uL (ref 150–400)
RBC: 4.73 MIL/uL (ref 3.87–5.11)
RDW: 13.7 % (ref 11.5–15.5)
WBC: 15.6 10*3/uL — ABNORMAL HIGH (ref 4.0–10.5)
nRBC: 0 % (ref 0.0–0.2)

## 2023-08-13 LAB — BASIC METABOLIC PANEL
Anion gap: 12 (ref 5–15)
BUN: 13 mg/dL (ref 8–23)
CO2: 25 mmol/L (ref 22–32)
Calcium: 8.9 mg/dL (ref 8.9–10.3)
Chloride: 100 mmol/L (ref 98–111)
Creatinine, Ser: 0.63 mg/dL (ref 0.44–1.00)
GFR, Estimated: >60 mL/min (ref >60)
Glucose, Bld: 205 mg/dL — ABNORMAL HIGH (ref 70–99)
Potassium: 4.3 mmol/L (ref 3.5–5.1)
Sodium: 137 mmol/L (ref 135–145)

## 2023-08-13 LAB — EXPECTORATED SPUTUM ASSESSMENT W GRAM STAIN, RFLX TO RESP C

## 2023-08-13 LAB — HIV ANTIBODY (ROUTINE TESTING W REFLEX): HIV Screen 4th Generation wRfx: NONREACTIVE

## 2023-08-13 LAB — STREP PNEUMONIAE URINARY ANTIGEN: Strep Pneumo Urinary Antigen: NEGATIVE

## 2023-08-13 MED ORDER — SODIUM CHLORIDE 0.9 % IV SOLN
500.0000 mg | INTRAVENOUS | Status: DC
  Administered 2023-08-13: 500 mg via INTRAVENOUS
  Filled 2023-08-13 (×3): qty 5

## 2023-08-13 MED ORDER — SODIUM CHLORIDE 0.9 % IV SOLN
1.0000 g | INTRAVENOUS | Status: DC
  Administered 2023-08-13: 1 g via INTRAVENOUS
  Filled 2023-08-13 (×3): qty 10

## 2023-08-13 NOTE — ED Notes (Signed)
RN aware bed assigned ?

## 2023-08-13 NOTE — ED Notes (Signed)
 Pt ambulated to in room toilet without assistance from staff and tolerated activity well. Pt is reconnected to VS machine and bed is in the lowest, locked position, with call bell in reach.

## 2023-08-13 NOTE — Hospital Course (Addendum)
Chelsea Marquez is a 68 y.o. female with medical history significant of A-fib not on anticoagulants, ITP, melanoma, who presents with cough and shortness of breath.  Patient had influenza 10 days ago. Chest x-ray showed mild left lower lobe atelectasis.  Patient was diagnosed with acute bronchitis.  Treated with Zithromax Rocephin and steroids.  Patient also requiring 4 L oxygen. Condition much improved, currently on 1 L oxygen with good saturation.  Will obtain home oxygen evaluation.  Medically stable for discharge.

## 2023-08-13 NOTE — Progress Notes (Signed)
  Progress Note   Patient: Chelsea Marquez XBJ:[id number] DOB: [birth year] DOA: 08/12/2023     1 DOS: the patient was seen and examined on 08/13/2023   Brief hospital course: Chelsea Marquez is a 68 y.o. female with medical history significant of A-fib not on anticoagulants, ITP, melanoma, who presents with cough and shortness of breath.  Patient had influenza 10 days ago. Chest x-ray showed mild left lower lobe atelectasis.  Patient was diagnosed with acute bronchitis.  Treated with Zithromax  Rocephin  and steroids.  Patient also requiring 4 L oxygen.   Principal Problem:   Acute bronchitis Active Problems:   PAF (paroxysmal atrial fibrillation) (HCC)   History of ITP   Assessment and Plan:  Acute bronchitis after Flu A infection:  Patient has negative respiratory viral panel.  Cultures so far negative. Will continue steroids, scheduled bronchodilator and antibiotics. Patient may be able to discharge in 1 to 2 days.   Hx of PAF (paroxysmal atrial fibrillation) [facility]):  Post cardioversion.  Currently in sinus.  History of ITP:  Repeat platelet count is normal.  Hyponatremia. Improved.      Subjective:  Patient still has some short of breath with exertion.  No wheezing.  No chest pain.  Physical Exam: Vitals:   08/13/23 0746 08/13/23 1000 08/13/23 1152 08/13/23 1300  BP:  112/61  111/61  Pulse:  74  83  Resp:  18  18  Temp:   98.4 F (36.9 C)   TempSrc:   Oral   SpO2: 97% 99%  97%  Weight:      Height:       General exam: Appears calm and comfortable  Respiratory system: Clear to auscultation. Respiratory effort normal. Cardiovascular system: S1 & S2 heard, RRR. No JVD, murmurs, rubs, gallops or clicks. No pedal edema. Gastrointestinal system: Abdomen is nondistended, soft and nontender. No organomegaly or masses felt. Normal bowel sounds heard. Central nervous system: Alert and oriented. No focal neurological deficits. Extremities: Symmetric 5 x 5 power. Skin: No rashes,  lesions or ulcers Psychiatry: Judgement and insight appear normal. Mood & affect appropriate.    Data Reviewed:  -Lab results reviewed.   x-ray reviewed.  Family Communication: None  Disposition: Status is: Inpatient Remains inpatient appropriate because: Severity of disease, IV treatment.     Time spent: 35 minutes  Author: Donaciano Frizzle, MD 08/13/2023 2:52 PM  For on call review [email or web address].

## 2023-08-13 NOTE — ED Notes (Signed)
 Pt ambulated to the bathroom and back to bed without incident.  Pt hooked back to monitors, CB within reach. NAD

## 2023-08-14 ENCOUNTER — Encounter: Payer: Self-pay | Admitting: Internal Medicine

## 2023-08-14 DIAGNOSIS — I480 Paroxysmal atrial fibrillation: Secondary | ICD-10-CM | POA: Diagnosis not present

## 2023-08-14 DIAGNOSIS — J209 Acute bronchitis, unspecified: Secondary | ICD-10-CM | POA: Diagnosis not present

## 2023-08-14 DIAGNOSIS — Z862 Personal history of diseases of the blood and blood-forming organs and certain disorders involving the immune mechanism: Secondary | ICD-10-CM | POA: Diagnosis not present

## 2023-08-14 LAB — LEGIONELLA PNEUMOPHILA SEROGP 1 UR AG: L. pneumophila Serogp 1 Ur Ag: NEGATIVE

## 2023-08-14 MED ORDER — FLUTICASONE-SALMETEROL 115-21 MCG/ACT IN AERO: 2.0000 | INHALATION_SPRAY | Freq: Two times a day (BID) | RESPIRATORY_TRACT | 0 refills | Status: AC

## 2023-08-14 MED ORDER — PREDNISONE 20 MG PO TABS: 40.0000 mg | ORAL_TABLET | Freq: Every day | ORAL | 0 refills | Status: AC

## 2023-08-14 MED ORDER — CEFDINIR 300 MG PO CAPS: 300.0000 mg | ORAL_CAPSULE | Freq: Two times a day (BID) | ORAL | 0 refills | Status: AC

## 2023-08-14 MED ORDER — AZITHROMYCIN 500 MG PO TABS: 500.0000 mg | ORAL_TABLET | Freq: Every day | ORAL | 0 refills | Status: AC

## 2023-08-14 NOTE — Discharge Summary (Signed)
 Physician Discharge Summary   Patient: Chelsea Marquez MRN: [id number] DOB: 11-12-1955  Admit date:     08/12/2023  Discharge date: 08/14/23  Discharge Physician: Marrion Coy   PCP: Dorothey Baseman, MD   Recommendations at discharge:   Follow with PCP in 1 week.  Discharge Diagnoses: Principal Problem:   Acute bronchitis Active Problems:   PAF (paroxysmal atrial fibrillation) (HCC)   History of ITP  Resolved Problems:   * No resolved hospital problems. * Hyponatremia. Hospital Course: Chelsea Marquez is a 68 y.o. female with medical history significant of A-fib not on anticoagulants, ITP, melanoma, who presents with cough and shortness of breath.  Patient had influenza 10 days ago. Chest x-ray showed mild left lower lobe atelectasis.  Patient was diagnosed with acute bronchitis.  Treated with Zithromax Rocephin and steroids.  Patient also requiring 4 L oxygen. Condition much improved, currently on 1 L oxygen with good saturation.  Will obtain home oxygen evaluation.  Medically stable for discharge.  Assessment and Plan: Acute bronchitis after Flu A infection:  No documented hypoxemia. Patient has negative respiratory viral panel.  Cultures so far negative. continued steroids, scheduled bronchodilator and antibiotics. Condition improved, will continue finish about 5 days antibiotics and 4 days of steroids.  Home oxygen evaluation.   Hx of PAF (paroxysmal atrial fibrillation) [facility]):  Post cardioversion.  Currently in sinus.   History of ITP:  Repeat platelet count is normal.   Hyponatremia. Improved.        Consultants: None Procedures performed: None  Disposition: Home Diet recommendation:  Discharge Diet Orders (From admission, onward)     Start     Ordered   08/14/23 0000  Diet - low sodium heart healthy        08/14/23 1043           Cardiac diet DISCHARGE MEDICATION: Allergies as of 08/14/2023   No Known Allergies      Medication List     STOP  taking these medications    amiodarone 200 MG tablet Commonly known as: PACERONE   benzonatate 200 MG capsule Commonly known as: TESSALON   diltiazem 30 MG tablet Commonly known as: CARDIZEM   Eliquis 5 MG Tabs tablet Generic drug: apixaban   MAGNESIUM PO   promethazine-dextromethorphan 6.25-15 MG/5ML syrup Commonly known as: PROMETHAZINE-DM       TAKE these medications    albuterol 108 (90 Base) MCG/ACT inhaler Commonly known as: VENTOLIN HFA Inhale into the lungs.   azithromycin 500 MG tablet Commonly known as: Zithromax Take 1 tablet (500 mg total) by mouth daily for 2 days.   calcium carbonate 1250 (500 Ca) MG tablet Commonly known as: OS-CAL - dosed in mg of elemental calcium Take 1 tablet by mouth.   cefdinir 300 MG capsule Commonly known as: OMNICEF Take 1 capsule (300 mg total) by mouth 2 (two) times daily for 4 days.   fluticasone-salmeterol 115-21 MCG/ACT inhaler Commonly known as: Advair HFA Inhale 2 puffs into the lungs 2 (two) times daily for 14 days.   JUICE PLUS FIBRE PO Take 4 tablets by mouth daily. Vegetable 2 Fruit 2   omega-3 acid ethyl esters 1 g capsule Commonly known as: LOVAZA Take 2,000 mg by mouth daily.   predniSONE 20 MG tablet Commonly known as: DELTASONE Take 2 tablets (40 mg total) by mouth daily with breakfast for 3 days. What changed: when to take this   Vitamin D3 125 MCG (5000 UT) Caps Take 5,000 Units by  mouth daily.        Follow-up Information     Dorothey Baseman, MD Follow up in 1 week(s).   Specialty: Family Medicine Contact information: [address] [place] [address] [phone]                Discharge Exam: Filed Weights   08/12/23 1630  Weight: 70.8 kg   General exam: Appears calm and comfortable  Respiratory system: Clear to auscultation. Respiratory effort normal. Cardiovascular system: S1 & S2 heard, RRR. No JVD, murmurs, rubs, gallops or clicks. No pedal  edema. Gastrointestinal system: Abdomen is nondistended, soft and nontender. No organomegaly or masses felt. Normal bowel sounds heard. Central nervous system: Alert and oriented. No focal neurological deficits. Extremities: Symmetric 5 x 5 power. Skin: No rashes, lesions or ulcers Psychiatry: Judgement and insight appear normal. Mood & affect appropriate.    Condition at discharge: good  The results of significant diagnostics from this hospitalization (including imaging, microbiology, ancillary and laboratory) are listed below for reference.   Imaging Studies: DG Chest 2 View Result Date: 08/12/2023 CLINICAL DATA:  Hypoxia. EXAM: CHEST - 2 VIEW COMPARISON:  None Available. FINDINGS: The lung base atelectasis or infiltrate. No pleural effusion pneumothorax. The cardiac silhouette is within normal limits. Atherosclerotic calcification of the aorta. Degenerative changes of spine. No acute osseous pathology. IMPRESSION: Mild lung base atelectasis or infiltrate. Electronically Signed   By: Elgie Collard M.D.   On: 08/12/2023 17:06    Microbiology: Results for orders placed or performed during the hospital encounter of 08/12/23  Resp panel by RT-PCR (RSV, Flu A&B, Covid) Anterior Nasal Swab     Status: None   Collection Time: 08/12/23  4:31 PM   Specimen: Anterior Nasal Swab  Result Value Ref Range Status   SARS Coronavirus 2 by RT PCR NEGATIVE NEGATIVE Final    Comment: (NOTE) SARS-CoV-2 target nucleic acids are NOT DETECTED.  The SARS-CoV-2 RNA is generally detectable in upper respiratory specimens during the acute phase of infection. The lowest concentration of SARS-CoV-2 viral copies this assay can detect is 138 copies/mL. A negative result does not preclude SARS-Cov-2 infection and should not be used as the sole basis for treatment or other patient management decisions. A negative result may occur with  improper specimen collection/handling, submission of specimen other than  nasopharyngeal swab, presence of viral mutation(s) within the areas targeted by this assay, and inadequate number of viral copies(<138 copies/mL). A negative result must be combined with clinical observations, patient history, and epidemiological information. The expected result is Negative.  Fact Sheet for Patients:  BloggerCourse.com  Fact Sheet for Healthcare Providers:  SeriousBroker.it  This test is no t yet approved or cleared by the [place] FDA and  has been authorized for detection and/or diagnosis of SARS-CoV-2 by FDA under an Emergency Use Authorization (EUA). This EUA will remain  in effect (meaning this test can be used) for the duration of the COVID-19 declaration under Section 564(b)(1) of the Act, 21 U.S.C.section 360bbb-3(b)(1), unless the authorization is terminated  or revoked sooner.       Influenza A by PCR NEGATIVE NEGATIVE Final   Influenza B by PCR NEGATIVE NEGATIVE Final    Comment: (NOTE) The Xpert Xpress SARS-CoV-2/FLU/RSV plus assay is intended as an aid in the diagnosis of influenza from Nasopharyngeal swab specimens and should not be used as a sole basis for treatment. Nasal washings and aspirates are unacceptable for Xpert Xpress SARS-CoV-2/FLU/RSV testing.  Fact Sheet for Patients:  BloggerCourse.com  Fact Sheet for Healthcare Providers: SeriousBroker.it  This test is not yet approved or cleared by the [place] FDA and has been authorized for detection and/or diagnosis of SARS-CoV-2 by FDA under an Emergency Use Authorization (EUA). This EUA will remain in effect (meaning this test can be used) for the duration of the COVID-19 declaration under Section 564(b)(1) of the Act, 21 U.S.C. section 360bbb-3(b)(1), unless the authorization is terminated or revoked.     Resp Syncytial Virus by PCR NEGATIVE NEGATIVE Final    Comment:  (NOTE) Fact Sheet for Patients: BloggerCourse.com  Fact Sheet for Healthcare Providers: SeriousBroker.it  This test is not yet approved or cleared by the [place] FDA and has been authorized for detection and/or diagnosis of SARS-CoV-2 by FDA under an Emergency Use Authorization (EUA). This EUA will remain in effect (meaning this test can be used) for the duration of the COVID-19 declaration under Section 564(b)(1) of the Act, 21 U.S.C. section 360bbb-3(b)(1), unless the authorization is terminated or revoked.  Performed at [facility], [address] [place], [place] [address]   Culture, blood (Routine x 2)     Status: None (Preliminary result)   Collection Time: 08/12/23  4:35 PM   Specimen: BLOOD  Result Value Ref Range Status   Specimen Description BLOOD RIGHT ANTECUBITAL  Final   Special Requests   Final    BOTTLES DRAWN AEROBIC AND ANAEROBIC Blood Culture adequate volume   Culture   Final    NO GROWTH 2 DAYS Performed at [facility], [address], [place], [place] [address]    Report Status PENDING  Incomplete  Culture, blood (Routine x 2)     Status: None (Preliminary result)   Collection Time: 08/12/23  6:44 PM   Specimen: BLOOD  Result Value Ref Range Status   Specimen Description BLOOD BLOOD LEFT ARM  Final   Special Requests   Final    BOTTLES DRAWN AEROBIC AND ANAEROBIC Blood Culture adequate volume   Culture   Final    NO GROWTH 2 DAYS Performed at [facility], [address] [place], [place] [address]    Report Status PENDING  Incomplete  Expectorated Sputum Assessment w Gram Stain, Rflx to Resp Cult     Status: None   Collection Time: 08/12/23 10:00 PM   Specimen: Sputum  Result Value Ref Range Status   Specimen Description SPU  Final   Special Requests NONE  Final   Sputum evaluation   Final    THIS SPECIMEN IS ACCEPTABLE FOR SPUTUM CULTURE Performed at [facility], [address], [place], [place] [address]    Report Status 08/13/2023 FINAL  Final  Culture, Respiratory w Gram Stain     Status: None (Preliminary result)   Collection Time: 08/12/23 10:00 PM   Specimen: Sputum  Result Value Ref Range Status   Specimen Description   Final    SPU Performed at [facility], [address] [place], [place] [address]    Special Requests   Final    NONE Reflexed from [phone] Performed at [facility], [address] [place], [place] [address]    Gram Stain   Final    FEW WBC PRESENT, PREDOMINANTLY PMN FEW GRAM POSITIVE COCCI RARE GRAM NEGATIVE RODS Performed at [facility], [address], [place], [place] [address]    Culture PENDING  Incomplete   Report Status PENDING  Incomplete    Labs: CBC: Recent  Labs  Lab 08/13/23 0344  WBC 15.6*  HGB 13.9  HCT 41.6  MCV 87.9  PLT 313   Basic Metabolic Panel: Recent Labs  Lab 08/12/23 1630 08/13/23 0344  NA 132* 137  K 3.7 4.3  CL 94* 100  CO2 23 25  GLUCOSE 131* 205*  BUN 11 13  CREATININE 0.64 0.63  CALCIUM 8.8* 8.9   Liver Function Tests: Recent Labs  Lab 08/12/23 1630  AST 27  ALT 18  ALKPHOS 57  BILITOT 1.0  PROT 7.8  ALBUMIN 3.8   CBG: No results for input(s): "GLUCAP" in the last 168 hours.  Discharge time spent: greater than 30 minutes.  Signed: Marrion Coy, MD [facility] 08/14/2023

## 2023-08-14 NOTE — TOC CM/SW Note (Signed)
Transition of Care [facility]) - Inpatient Brief Assessment   Patient Details  Name: DEMECIA NORTHWAY MRN: [id number] Date of Birth: 04-06-1956  [facility]) CM/SW Contact:    Allena Katz, LCSW Phone Number: 08/14/2023, 12:45 PM   Clinical Narrative:    Transition of Care Asessment: Insurance and Status: Insurance coverage has been reviewed Patient has primary care physician: Yes Home environment has been reviewed: lives with spouse Prior level of function:: independent Prior/Current Home Services: No current home services Social Drivers of Health Review: SDOH reviewed no interventions necessary Readmission risk has been reviewed: Yes Transition of care needs: no transition of care needs at this time

## 2023-08-14 NOTE — Plan of Care (Signed)

## 2023-08-14 NOTE — Plan of Care (Signed)
Problem: Education: Goal: Knowledge of General Education information will improve Description: Including pain rating scale, medication(s)/side effects and non-pharmacologic comfort measures Outcome: Adequate for Discharge   Problem: Health Behavior/Discharge Planning: Goal: Ability to manage health-related needs will improve Outcome: Adequate for Discharge   Problem: Clinical Measurements: Goal: Ability to maintain clinical measurements within normal limits will improve Outcome: Adequate for Discharge Goal: Will remain free from infection Outcome: Adequate for Discharge Goal: Diagnostic test results will improve Outcome: Adequate for Discharge Goal: Respiratory complications will improve Outcome: Adequate for Discharge Goal: Cardiovascular complication will be avoided Outcome: Adequate for Discharge   Problem: Activity: Goal: Risk for activity intolerance will decrease Outcome: Adequate for Discharge   Problem: Nutrition: Goal: Adequate nutrition will be maintained Outcome: Adequate for Discharge   Problem: Coping: Goal: Level of anxiety will decrease Outcome: Adequate for Discharge   Problem: Elimination: Goal: Will not experience complications related to bowel motility Outcome: Adequate for Discharge Goal: Will not experience complications related to urinary retention Outcome: Adequate for Discharge   Problem: Pain Managment: Goal: General experience of comfort will improve and/or be controlled Outcome: Adequate for Discharge   Problem: Safety: Goal: Ability to remain free from injury will improve Outcome: Adequate for Discharge   Problem: Skin Integrity: Goal: Risk for impaired skin integrity will decrease Outcome: Adequate for Discharge   Problem: Education: Goal: Knowledge of disease or condition will improve Outcome: Adequate for Discharge Goal: Knowledge of the prescribed therapeutic regimen will improve Outcome: Adequate for Discharge Goal:  Individualized Educational Video(s) Outcome: Adequate for Discharge   Problem: Activity: Goal: Ability to tolerate increased activity will improve Outcome: Adequate for Discharge Goal: Will verbalize the importance of balancing activity with adequate rest periods Outcome: Adequate for Discharge   Problem: Respiratory: Goal: Ability to maintain a clear airway will improve Outcome: Adequate for Discharge Goal: Levels of oxygenation will improve Outcome: Adequate for Discharge Goal: Ability to maintain adequate ventilation will improve Outcome: Adequate for Discharge   Problem: Activity: Goal: Ability to tolerate increased activity will improve Outcome: Adequate for Discharge   Problem: Clinical Measurements: Goal: Ability to maintain a body temperature in the normal range will improve Outcome: Adequate for Discharge   Problem: Respiratory: Goal: Ability to maintain adequate ventilation will improve Outcome: Adequate for Discharge Goal: Ability to maintain a clear airway will improve Outcome: Adequate for Discharge

## 2023-08-15 ENCOUNTER — Other Ambulatory Visit: Payer: Self-pay | Admitting: Family Medicine

## 2023-08-15 DIAGNOSIS — Z1231 Encounter for screening mammogram for malignant neoplasm of breast: Secondary | ICD-10-CM

## 2023-08-16 LAB — CULTURE, RESPIRATORY W GRAM STAIN

## 2023-08-17 LAB — CULTURE, BLOOD (ROUTINE X 2)
Culture: NO GROWTH
Culture: NO GROWTH
Special Requests: ADEQUATE
Special Requests: ADEQUATE

## 2023-09-03 ENCOUNTER — Ambulatory Visit
Admission: RE | Admit: 2023-09-03 | Discharge: 2023-09-03 | Disposition: A | Discharge status: Home / Self Care | Payer: Medicare PPO | Source: Ambulatory Visit | Attending: Family Medicine | Admitting: Family Medicine

## 2023-09-03 DIAGNOSIS — Z1231 Encounter for screening mammogram for malignant neoplasm of breast: Secondary | ICD-10-CM | POA: Insufficient documentation
